# Patient Record
Sex: Female | Born: 1950 | Race: White | Hispanic: No | Marital: Married | Smoking: Never smoker
Health system: Southern US, Community
[De-identification: ages and names within clinical notes are randomized; demographics above are authoritative.]

## PROBLEM LIST (undated history)

## (undated) DIAGNOSIS — C50919 Malignant neoplasm of unspecified site of unspecified female breast: Secondary | ICD-10-CM

## (undated) DIAGNOSIS — C801 Malignant (primary) neoplasm, unspecified: Secondary | ICD-10-CM

## (undated) DIAGNOSIS — Z9889 Other specified postprocedural states: Secondary | ICD-10-CM

## (undated) DIAGNOSIS — I471 Supraventricular tachycardia: Secondary | ICD-10-CM

## (undated) DIAGNOSIS — R112 Nausea with vomiting, unspecified: Secondary | ICD-10-CM

## (undated) HISTORY — PX: BREAST LUMPECTOMY: SHX2

---

## 2003-03-08 ENCOUNTER — Other Ambulatory Visit: Admission: RE | Admit: 2003-03-08 | Discharge: 2003-03-08 | Payer: Self-pay | Admitting: *Deleted

## 2003-03-23 ENCOUNTER — Encounter: Admission: RE | Admit: 2003-03-23 | Discharge: 2003-03-23 | Payer: Self-pay | Admitting: *Deleted

## 2003-03-25 ENCOUNTER — Ambulatory Visit (HOSPITAL_COMMUNITY): Admission: RE | Admit: 2003-03-25 | Discharge: 2003-03-25 | Payer: Self-pay | Admitting: Gastroenterology

## 2003-07-15 HISTORY — PX: WRIST SURGERY: SHX841

## 2003-07-21 ENCOUNTER — Ambulatory Visit (HOSPITAL_BASED_OUTPATIENT_CLINIC_OR_DEPARTMENT_OTHER): Admission: RE | Admit: 2003-07-21 | Discharge: 2003-07-21 | Payer: Self-pay | Admitting: Orthopedic Surgery

## 2003-09-26 ENCOUNTER — Encounter: Admission: RE | Admit: 2003-09-26 | Discharge: 2003-09-26 | Payer: Self-pay | Admitting: *Deleted

## 2004-05-04 ENCOUNTER — Encounter: Admission: RE | Admit: 2004-05-04 | Discharge: 2004-05-04 | Payer: Self-pay | Admitting: *Deleted

## 2005-06-17 ENCOUNTER — Encounter: Admission: RE | Admit: 2005-06-17 | Discharge: 2005-06-17 | Payer: Self-pay | Admitting: Obstetrics and Gynecology

## 2006-09-04 ENCOUNTER — Encounter: Admission: RE | Admit: 2006-09-04 | Discharge: 2006-09-04 | Payer: Self-pay | Admitting: Obstetrics and Gynecology

## 2007-10-01 ENCOUNTER — Encounter: Admission: RE | Admit: 2007-10-01 | Discharge: 2007-10-01 | Payer: Self-pay | Admitting: Obstetrics and Gynecology

## 2008-10-18 ENCOUNTER — Encounter: Admission: RE | Admit: 2008-10-18 | Discharge: 2008-10-18 | Payer: Self-pay | Admitting: Obstetrics and Gynecology

## 2009-10-19 ENCOUNTER — Encounter: Admission: RE | Admit: 2009-10-19 | Discharge: 2009-10-19 | Payer: Self-pay | Admitting: Obstetrics and Gynecology

## 2010-06-01 NOTE — Op Note (Signed)
NAME:  Crystal Robles, Crystal Robles                       ACCOUNT NO.:  [id number]   MEDICAL RECORD NO.:  [id number]                   PATIENT TYPE:  AMB   LOCATION:  ENDO                                 FACILITY:  [facility]   PHYSICIAN:  Anselmo Rod, M.D.               DATE OF BIRTH:  [birth year]   DATE OF PROCEDURE:  03/25/2003  DATE OF DISCHARGE:                                 OPERATIVE REPORT   PROCEDURE PERFORMED:  Screening colonoscopy.   ENDOSCOPIST:  Anselmo Rod, M.D.   INSTRUMENT USED:  Olympus video colonoscope (adjustable pediatric scope).   INDICATION FOR PROCEDURE:  A 60 year old white female undergoing screening  colonoscopy to rule out colonic polyps, masses, etc.   PREPROCEDURE PREPARATION:  Informed consent was procured from the patient.  The patient was fasted for eight hours prior to the procedure and prepped  with a bottle of magnesium citrate and a gallon of GoLYTELY the night prior  to the procedure.  She also received 400 mg of Cipro intravenously for  prophylaxis because of a history of valvular dysfunction and a questionable  history of MVP.   PREPROCEDURE PHYSICAL:  VITAL SIGNS:  The patient had stable vital signs.  NECK:  Supple.  CHEST:  Clear to auscultation.  S1, S2 regular.  ABDOMEN:  Soft with normal bowel sounds.   DESCRIPTION OF PROCEDURE:  The patient was placed in the left lateral  decubitus position and sedated with 80 mg of Demerol and 8 mg of Versed in  slow incremental doses.  Once the patient was adequately sedate and  maintained on low-flow oxygen and continuous cardiac monitoring, the Olympus  video colonoscope was advanced from the rectum to the cecum without  difficulty.  No masses, polyps, erosions, or diverticula were seen.  The  appendiceal orifice and the ileocecal valve were clearly visualized and  photographed.  Retroflexion in the rectum revealed no abnormalities.  The  patient tolerated the procedure well without immediate  complications.   IMPRESSION:  Normal colonoscopy up to the cecum.  No masses, polyps, or  diverticulosis noted.   RECOMMENDATIONS:  1. Repeat CRC screening is recommended in the next 10 years unless the     patient develops any abnormal     symptoms in the interim.  2. Outpatient follow-up as the need arises in the future.  3. Continue a high-fiber diet with liberal fluid intake.                                               Anselmo Rod, M.D.    JNM/MEDQ  D:  03/25/2003  T:  03/26/2003  Job:  [id number]   cc:   Marjory Lies, M.D.  [address]  [place]  [place] [address]  Fax: [phone]
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
## 2010-11-30 ENCOUNTER — Other Ambulatory Visit: Payer: Self-pay | Admitting: Obstetrics and Gynecology

## 2010-11-30 DIAGNOSIS — Z1231 Encounter for screening mammogram for malignant neoplasm of breast: Secondary | ICD-10-CM

## 2010-12-27 ENCOUNTER — Ambulatory Visit
Admission: RE | Admit: 2010-12-27 | Discharge: 2010-12-27 | Disposition: A | Discharge status: Home / Self Care | Payer: Managed Care, Other (non HMO) | Source: Ambulatory Visit | Attending: Obstetrics and Gynecology | Admitting: Obstetrics and Gynecology

## 2010-12-27 DIAGNOSIS — Z1231 Encounter for screening mammogram for malignant neoplasm of breast: Secondary | ICD-10-CM

## 2011-10-03 ENCOUNTER — Other Ambulatory Visit: Payer: Self-pay | Admitting: Obstetrics and Gynecology

## 2011-10-03 DIAGNOSIS — Z1231 Encounter for screening mammogram for malignant neoplasm of breast: Secondary | ICD-10-CM

## 2011-12-30 ENCOUNTER — Ambulatory Visit
Admission: RE | Admit: 2011-12-30 | Discharge: 2011-12-30 | Disposition: A | Discharge status: Home / Self Care | Payer: Self-pay | Source: Ambulatory Visit | Attending: Obstetrics and Gynecology | Admitting: Obstetrics and Gynecology

## 2011-12-30 DIAGNOSIS — Z1231 Encounter for screening mammogram for malignant neoplasm of breast: Secondary | ICD-10-CM

## 2012-06-02 ENCOUNTER — Encounter: Payer: Self-pay | Admitting: Sports Medicine

## 2012-06-02 ENCOUNTER — Ambulatory Visit (INDEPENDENT_AMBULATORY_CARE_PROVIDER_SITE_OTHER): Payer: BC Managed Care – PPO | Admitting: Sports Medicine

## 2012-06-02 VITALS — BP 125/81 | HR 60 | Ht 64.25 in | Wt 148.0 lb

## 2012-06-02 DIAGNOSIS — M25519 Pain in unspecified shoulder: Secondary | ICD-10-CM

## 2012-06-02 DIAGNOSIS — M25511 Pain in right shoulder: Secondary | ICD-10-CM

## 2012-06-02 NOTE — Assessment & Plan Note (Signed)
This is clealry improving Given a series of HEP with RC strength work Keep weight very low for 6 weeks  If still symptomatic at 6 weeks, I want to reck and do [place]  Ice prn motrin

## 2012-06-02 NOTE — Progress Notes (Signed)
Patient ID: Crystal Robles, female   DOB: 07-15-1950, 62 y.o.   MRN: [id number]  RT shoulder pain past 2 mos Started at end of day after weight class No specific lift Now doing light weight with slt pain only ADLS if too much make it throb at night Icing - and this is somewhat better Rested some and cut weights Now definitely better Wants recheck before starting much weight work  Pexam  NAD  Shoulder: Inspection reveals no abnormalities, atrophy or asymmetry. Palpation is normal with no tenderness over AC joint or bicipital groove. ROM is full in all planes. Rotator cuff strength normal throughout. No signs of impingement with negative Neer and Hawkin's tests, empty can. Speeds and Yergason's tests normal. No labral pathology noted with negative Obrien's, negative clunk and good stability. Normal scapular function observed. No painful arc and no drop arm sign. No apprehension sign  What she does note is some mild pain in post cuff and upper deltoid on IR/ER testing as well as cross over

## 2012-11-24 ENCOUNTER — Other Ambulatory Visit: Payer: Self-pay

## 2012-11-24 DIAGNOSIS — Z1231 Encounter for screening mammogram for malignant neoplasm of breast: Secondary | ICD-10-CM

## 2012-12-03 ENCOUNTER — Other Ambulatory Visit: Payer: Self-pay | Admitting: Obstetrics and Gynecology

## 2012-12-03 DIAGNOSIS — M8580 Other specified disorders of bone density and structure, unspecified site: Secondary | ICD-10-CM

## 2012-12-30 ENCOUNTER — Ambulatory Visit: Payer: BC Managed Care – PPO

## 2013-01-08 ENCOUNTER — Ambulatory Visit
Admission: RE | Admit: 2013-01-08 | Discharge: 2013-01-08 | Disposition: A | Discharge status: Home / Self Care | Payer: BC Managed Care – PPO | Source: Ambulatory Visit

## 2013-01-08 ENCOUNTER — Ambulatory Visit
Admission: RE | Admit: 2013-01-08 | Discharge: 2013-01-08 | Disposition: A | Discharge status: Home / Self Care | Payer: BC Managed Care – PPO | Source: Ambulatory Visit | Attending: Obstetrics and Gynecology | Admitting: Obstetrics and Gynecology

## 2013-01-08 DIAGNOSIS — M8580 Other specified disorders of bone density and structure, unspecified site: Secondary | ICD-10-CM

## 2013-01-08 DIAGNOSIS — Z1231 Encounter for screening mammogram for malignant neoplasm of breast: Secondary | ICD-10-CM

## 2013-12-24 ENCOUNTER — Other Ambulatory Visit: Payer: Self-pay

## 2013-12-24 DIAGNOSIS — Z1231 Encounter for screening mammogram for malignant neoplasm of breast: Secondary | ICD-10-CM

## 2014-01-10 ENCOUNTER — Ambulatory Visit
Admission: RE | Admit: 2014-01-10 | Discharge: 2014-01-10 | Disposition: A | Discharge status: Home / Self Care | Payer: BC Managed Care – PPO | Source: Ambulatory Visit

## 2014-01-10 ENCOUNTER — Encounter (INDEPENDENT_AMBULATORY_CARE_PROVIDER_SITE_OTHER): Payer: Self-pay

## 2014-01-10 DIAGNOSIS — Z1231 Encounter for screening mammogram for malignant neoplasm of breast: Secondary | ICD-10-CM

## 2014-03-02 ENCOUNTER — Encounter: Payer: Self-pay | Admitting: Sports Medicine

## 2014-03-02 ENCOUNTER — Ambulatory Visit (INDEPENDENT_AMBULATORY_CARE_PROVIDER_SITE_OTHER): Payer: BLUE CROSS/BLUE SHIELD | Admitting: Sports Medicine

## 2014-03-02 VITALS — BP 106/68 | HR 67 | Ht 64.0 in | Wt 132.0 lb

## 2014-03-02 DIAGNOSIS — M67919 Unspecified disorder of synovium and tendon, unspecified shoulder: Secondary | ICD-10-CM | POA: Insufficient documentation

## 2014-03-02 DIAGNOSIS — M67911 Unspecified disorder of synovium and tendon, right shoulder: Secondary | ICD-10-CM | POA: Diagnosis not present

## 2014-03-02 MED ORDER — NITROGLYCERIN 0.2 MG/HR TD PT24: MEDICATED_PATCH | TRANSDERMAL | Status: DC

## 2014-03-02 NOTE — Patient Instructions (Signed)
You have a partial thickness tear in the supraspinatus (a rotator cuff tendon) with also some mild arthritis of the humeral head and long head of the biceps tendon irritation.  -The following rehab exercises will be beneficial. Try to do these at least 4-5 times per week. 1. Palm up/palm down with elbow bent to 90 degrees, 3-5 pound weight. 10-15 times for 3 sets. 2. Biceps curls with 3-5 pound weight, 10-15 x 3 sets. 3. Spokes on a wheel, holding 3-5 pound weight, raise arm straight in front to shoulder level, then out to 45 degrees, then to side at 90 degrees. 10-15 x 3 sets. 4. Lawnmower pulls, leaning forward, letting arm hand down in front of you, pull up with elbow close to your body and back down, 10-15 times x 3 sets.  Nitroglycerin Protocol   Apply 1/4 nitroglycerin patch to affected area daily.  Change position of patch within the affected area every 24 hours.  You may experience a headache during the first 1-2 weeks of using the patch, these should subside.  If you experience headaches after beginning nitroglycerin patch treatment, you may take your preferred over the counter pain reliever.  Another side effect of the nitroglycerin patch is skin irritation or rash related to patch adhesive.  Please notify our office if you develop more severe headaches or rash, and stop the patch.  Tendon healing with nitroglycerin patch may require 12 to 24 weeks depending on the extent of injury.  Men should not use if taking Viagra, Cialis, or Levitra.   Do not use if you have migraines or rosacea.  We will see you back in 6 weeks for re-scan or sooner if needed.

## 2014-03-02 NOTE — Progress Notes (Signed)
   Subjective:    Patient ID: Crystal Robles, female    DOB: 1950-06-30, 64 y.o.   MRN: [id number]  HPI Crystal Robles is a 64 year old right-hand-dominant female who presents with right shoulder pain. Onset was 6-7 months ago without any known injury, but she is fairly active with kayaking. Location of pain is superior lateral and anterior right shoulder. Symptoms are aggravated with reaching overhead or behind her. She tried Motrin, icing, and a few visits with physical therapy. Despite this, her symptoms have persisted. Occasionally her pain will wake her up at night. She denies any frequent radiation to the hand, numbness, tingling, or weakness. She denies any significant neck pain.  Past medical history, social history, medications, and allergies were reviewed and are up to date in the chart. Review of Systems 7 point review of systems was performed and was otherwise negative unless noted in the history of present illness.     Objective:   Physical Exam BP 106/68 mmHg  Pulse 67  Ht 5\' 4"  (1.626 m)  Wt 132 lb (59.875 kg)  BMI 22.65 kg/m2 GEN: The patient is well-developed well-nourished female and in no acute distress.  She is awake alert and oriented x3. SKIN: warm and well-perfused, no rash  Neuro: Strength 5/5 globally. Sensation intact throughout. No focal deficits. Vasc: +2 bilateral distal pulses. No edema.  MSK: Atrophy: none   Cervical ROM Full  Shoulder ROM: Right <---> Left   Forward flexion 170<--->180 ER at side 60<--->60 Abd ER 90<--->90 Abd IR 60<--->60 IR up back T10<--->T6  TTP:  AC joint:  -  Supraspinatus insertion:  +  Subsca/biceps:  -  Periscapular:  -  Trap:  -  Cuff: Impingement/cuff: Hawkins +   Jobes: +  FF strength:4/5   ER strength:5/5   Abdominal compression test:- Lag signs:None  Laxity/instabilty:  -  Yergason:  -           Speeds:  -    Crank:  -                     Active Compression: -  Neurovascular: Normal sensation to light touch in  median ulnar and radial nerve distribution with good strength in hand intrinsics grip and EPL, 2+ radial pulse.  Limited musculoskeletal ultrasound: Long and short axis views were obtained of the right shoulder. There appears to be hypoechoic fluid collection seen around the biceps tendon. The biceps tendon fibers appear grossly intact. The subscapularis appears normal. The supraspinatus has a 30-40% partial thickness tear with slight partial retraction. In the interval view, this tear and its surrounding hypoechoic fluid seems to communicate with the biceps tendon sheath. There is a small microcalcification seen within the supraspinatus tear and increased neovascularization on Doppler. The infraspinatus and teres minor appear normal. The acromioclavicular joint appears normal. There are some degenerative changes in the anterior portion of the humeral head.     Assessment & Plan:  Please see problem based assessment and plan in the problem list.

## 2014-03-02 NOTE — Assessment & Plan Note (Signed)
-  Nitroglycerin protocol -Home exercise program -Avoid aggravating factors -Follow-up 6 weeks for rescan or sooner if needed

## 2014-04-20 ENCOUNTER — Ambulatory Visit (INDEPENDENT_AMBULATORY_CARE_PROVIDER_SITE_OTHER): Payer: BLUE CROSS/BLUE SHIELD | Admitting: Sports Medicine

## 2014-04-20 ENCOUNTER — Encounter: Payer: Self-pay | Admitting: Sports Medicine

## 2014-04-20 VITALS — BP 109/44 | Ht 64.5 in | Wt 132.0 lb

## 2014-04-20 DIAGNOSIS — M67911 Unspecified disorder of synovium and tendon, right shoulder: Secondary | ICD-10-CM

## 2014-04-20 MED ORDER — NITROGLYCERIN 0.2 MG/HR TD PT24: MEDICATED_PATCH | TRANSDERMAL | Status: DC

## 2014-04-20 NOTE — Assessment & Plan Note (Signed)
Interval improvement in the scan showing approximately 10% partial thickness tear with retraction 04/20/14 on ultrasound. The hypoechoic fluid collection surrounding the biceps tendon persists, but the tendon appears normal. -Continue nitroglycerin protocol for 6 additional weeks, but may require longer. -Advance home exercise program gradually, with caution against being too aggressive due to risk of complete tendon rupture. -Refilled nitroglycerin patches today -Follow-up in 6 weeks for reevaluation with ultrasound or sooner if needed.

## 2014-04-20 NOTE — Progress Notes (Signed)
   Subjective:    Patient ID: Crystal Robles, female    DOB: 01/03/1951, 64 y.o.   MRN: [id number]  HPI Mrs. Crystal Robles is a 64 year old right-hand-dominant female who presents for follow-up of right shoulder pain. Onset was 8-9 months ago without any known injury. Location of pain is superior lateral and anterior right shoulder. Symptoms are aggravated with reaching overhead or behind her. She initially tried Motrin, icing, and a few visits with physical therapy. She has been using the nitroglycerin patch daily for the past 6 weeks. She feels that her pain is improving, but she still has some mild discomfort in the right shoulder. She has been compliant with her home exercise program. She denies anyradiation to the hand, numbness, tingling, or weakness. She denies any significant neck pain.  Past medical history, social history, medications, and allergies were reviewed and are up to date in the chart.  Review of Systems 7 point review of systems was performed and was otherwise negative unless noted in the history of present illness.     Objective:   Physical Exam BP 109/44 mmHg  Ht 5' 4.5" (1.638 m)  Wt 132 lb (59.875 kg)  BMI 22.32 kg/m2 GEN: The patient is well-developed well-nourished female and in no acute distress.  She is awake alert and oriented x3. SKIN: warm and well-perfused, no rash  Neuro: Strength 5/5 globally. Sensation intact throughout. No focal deficits. Vasc: +2 bilateral distal pulses. MSK: Examination of the right shoulder reveals grossly full range of motion in all planes. Positive Jobes test. Negative speeds test. Positive Hawkins test. No tenderness at the acromioclavicular joint. She has fairly good rotator cuff strength. Negative abdominal compression test.  Limited musculoskeletal ultrasound: Long and short axis views were obtained of the right shoulder. Examination of the biceps reveals redemonstration of the previously seen hypoechoic fluid collection surrounding  the biceps tendon fibers. The tendon itself appears grossly fully intact. Examination of the supraspinatus reveals interval improvement in previously seen partial thickness tear. She has approximately 10% partial thickness tear currently, which is improved from the previous 30%. There is still retraction seen distally at the partially torn region. There is a micro-calcification seen adjacent to the tear. The infraspinatus, teres minor, and acromioclavicular joints appear normal.     Assessment & Plan:  Please see problem based assessment and plan in the problem list.

## 2014-06-01 ENCOUNTER — Ambulatory Visit (INDEPENDENT_AMBULATORY_CARE_PROVIDER_SITE_OTHER): Payer: BLUE CROSS/BLUE SHIELD | Admitting: Sports Medicine

## 2014-06-01 ENCOUNTER — Encounter: Payer: Self-pay | Admitting: Sports Medicine

## 2014-06-01 VITALS — BP 94/42 | Ht 64.5 in | Wt 132.0 lb

## 2014-06-01 DIAGNOSIS — M67911 Unspecified disorder of synovium and tendon, right shoulder: Secondary | ICD-10-CM | POA: Diagnosis not present

## 2014-06-01 MED ORDER — NITROGLYCERIN 0.2 MG/HR TD PT24: MEDICATED_PATCH | TRANSDERMAL | Status: DC

## 2014-06-01 NOTE — Assessment & Plan Note (Signed)
Ultrasound with stable ultrasound.  Compared to last time with repeat demonstration of less than 10% partial-thickness tear of the supraspinatus without retraction and less hyperkeratotic around the biceps tendon. -Continue home exercise program -Continue nitroglycerin for the next 3 months, refilled -Plan followup in 3 months or sooner if needed

## 2014-06-01 NOTE — Progress Notes (Signed)
   Subjective:    Patient ID: Crystal Robles, female    DOB: [birth year], 64 y.o.   MRN: [id number]  HPI Crystal Robles is a 64 year-old female who presents for follow-up of right shoulder pain.  She says that she is 85% improved.  She has been doing home exercise program including scapular stabilization exercises.  She has been on the nitroglycerin patch for 12 weeks. Recall that she had a 30% partial thickness tear of the supraspinatus with retraction seen in February, which had improved to approximately 10% in March.  She also had some fluid surrounding her biceps tendon without frank tear of the biceps fibers. Symptoms are aggravated with abduction of the arm above 90.  She denies any weakness, numbness, or tingling.  Past medical history, social history, medications, and allergies were reviewed and are up to date in the chart.  Review of Systems 7 point review of systems was performed and was otherwise negative unless noted in the history of present illness.     Objective:   Physical Exam BP 94/42 mmHg  Ht 5' 4.5" (1.638 m)  Wt 132 lb (59.875 kg)  BMI 22.32 kg/m2 GEN: The patient is well-developed well-nourished female and in no acute distress.  She is awake alert and oriented x3. SKIN: warm and well-perfused, no rash  Neuro: Strength 5/5 globally. Sensation intact throughout. DTRs 2/4 bilaterally. No focal deficits. Vasc: +2 bilateral distal pulses. No edema.  MSK: Atrophy: [none]   Cervical ROM [Full]    Shoulder ROM: Right <---> Left   Forward flexion [180]<--->[180] ER at side [60]<--->[60] Abd ER [90]<--->[90] Abd IR [60]<--->[60] IR up back [T10]<--->[T6]  TTP:  AC joint:  [-]  Supraspinatus insertion:  [-]  Subsca/biceps:  [-]  Periscapular:  [-]  Trap:  [-]  Cuff: Impingement/cuff: Hawkins [-]   Jobes: [-]   FF strength:[5/5]   ER strength:[5/5]   Abdominal compression test:[-] Lag signs:[None]  Laxity/instabilty:  [-]  Yergason:  [-]           Speeds:  [-]      Crank:  [-]                     Active Compression: [-]  Neurovascular: [Normal sensation to light touch in median ulnar and radial nerve distribution with good strength in hand intrinsics grip and EPL, 2+ radial pulse.]  Limited musculoskeletal ultrasound: long and short axis views are obtained of the right shoulder.  There still appears to be a less than 10% distal supraspinatus partial thickness tear with mild retraction.  The microcalcification was redemonstrated.  There appears to be less hypoechoic fluid surrounding the biceps tendon with intact fibers.    Assessment & Plan:  Please see problem based assessment and plan in the problem list.

## 2014-12-22 ENCOUNTER — Other Ambulatory Visit: Payer: Self-pay

## 2014-12-22 DIAGNOSIS — Z1231 Encounter for screening mammogram for malignant neoplasm of breast: Secondary | ICD-10-CM

## 2015-01-12 ENCOUNTER — Ambulatory Visit
Admission: RE | Admit: 2015-01-12 | Discharge: 2015-01-12 | Disposition: A | Discharge status: Home / Self Care | Payer: BLUE CROSS/BLUE SHIELD | Source: Ambulatory Visit

## 2015-01-12 DIAGNOSIS — Z1231 Encounter for screening mammogram for malignant neoplasm of breast: Secondary | ICD-10-CM

## 2016-02-12 ENCOUNTER — Other Ambulatory Visit: Payer: Self-pay | Admitting: Obstetrics and Gynecology

## 2016-02-12 DIAGNOSIS — Z1231 Encounter for screening mammogram for malignant neoplasm of breast: Secondary | ICD-10-CM

## 2016-03-08 ENCOUNTER — Ambulatory Visit
Admission: RE | Admit: 2016-03-08 | Discharge: 2016-03-08 | Disposition: A | Discharge status: Home / Self Care | Payer: BLUE CROSS/BLUE SHIELD | Source: Ambulatory Visit | Attending: Obstetrics and Gynecology | Admitting: Obstetrics and Gynecology

## 2016-03-08 DIAGNOSIS — Z1231 Encounter for screening mammogram for malignant neoplasm of breast: Secondary | ICD-10-CM

## 2016-10-08 ENCOUNTER — Encounter: Payer: Self-pay | Admitting: Sports Medicine

## 2016-10-08 ENCOUNTER — Ambulatory Visit (INDEPENDENT_AMBULATORY_CARE_PROVIDER_SITE_OTHER): Payer: BLUE CROSS/BLUE SHIELD | Admitting: Sports Medicine

## 2016-10-08 ENCOUNTER — Ambulatory Visit: Payer: Self-pay

## 2016-10-08 VITALS — BP 118/64 | Ht 64.0 in | Wt 140.0 lb

## 2016-10-08 DIAGNOSIS — M25511 Pain in right shoulder: Secondary | ICD-10-CM

## 2016-10-08 DIAGNOSIS — M67911 Unspecified disorder of synovium and tendon, right shoulder: Secondary | ICD-10-CM

## 2016-10-08 MED ORDER — NITROGLYCERIN 0.2 MG/HR TD PT24: MEDICATED_PATCH | TRANSDERMAL | 1 refills | Status: DC

## 2016-10-08 NOTE — Patient Instructions (Addendum)
Crystal Robles, you were seen today for right shoulder pain.  We noticed on your physical exam that you seem to be having some catching of your scapula when you move your shoulders in certain motions and pain with certain movements consistent with an impingement.  On ultrasound we saw your old tear in one muscle tendon, but also saw a bone spur in another spot that seems to be causing some inflammation with certain movements.   We are recommending some exercises to strengthen your upper back muscles and have provided you with a handout: 1. Lawnmower (one arm) 2. Robbery (two arms) 3. Biceps curl 4. Upright rows (3 lbs) 5. Forearm rolls  Please continue these exercises and also try nitroglycerine 1/4 daily on your shoulder.   Please continue with your daily exercises and nitroglycerine and we will see you back in 6 weeks.  Very nice to see you today, Crystal Robles L. Rosalyn Gess, [facility] Resident PGY-2 10/08/2016 11:22 AM

## 2016-10-08 NOTE — Progress Notes (Signed)
   [facility] [address] [place], [place] [address] Phone: [phone] Fax: [phone]   Patient Name: Crystal Robles Date of Birth: [birth year] Medical Record Number: [id number] Gender: female Date of Encounter: 10/08/2016  History of Present Illness:  Crystal Robles is a 66 y.o. very pleasant female patient who presents with the following:  Right shoulder pain for past couple of months. History of supraspinatus tear. Feels different than before. Pain is worse with shoulder abduction. Sometimes radiates to the elbow.  Pain is most evident when she is doing her ADLs (eating, chopping up food). Ibuprofen helps a little bit. Uses hot water bottle at night and sometimes this helps. Does hurt when she lies on her left side. No injuries.   Past Medical, Surgical, Social, and Family History Reviewed. Medications and Allergies reviewed and all updated if necessary.  Review of Systems:  Denies trauma, numbness, tingling or weakness in her upper extremity.   Physical Examination: Vitals:   10/08/16 1016  BP: 118/64   Vitals:   10/08/16 1016  Weight: 140 lb (63.5 kg)  Height: 5\' 4"  (1.626 m)   Body mass index is 24.03 kg/m.  General: well appearing 66 yo F in NAD Cardiac: well perfused Resp: NWOB MSK:   Shoulders: No deformities, edema or ecchymoses on inspection, some generalized TTP at the deltoid on the R side. Full ROM, but pain with abduction >70 degrees on R side with some crepitus. Discomfort with resisted internal rotation with the shoulder flexed. No AC joint tenderness. Positive obriens, empty can, hawkins and apprehension as well as clunk test. Speeds, yurgisons and sulcus signs negative. No joint instability. There was some scapular diskinesis with overhead movements, but not at rest with arms at her side. No improvement of impingement with scapular assist.  Neuro: grossly normal, no changes in sensation, strength 5/5 in upper  extremities.   Ultrasound: R shoulder At distal portion of the proximal insertion of biceps tendon there was a hypoechoic region between the pectoralis muscle and biceps tendon.  Bone spur noted at proximal insertion site of biceps tendon seen best on long axis. Normal appearing subscapularis tendon in static view, but there was a hypoechoic region between subscapularis and biceps tendon with dynamic view   Normal appearing AC joint  no subacromial or subdeltoid bursa impingement.  Improvement in oblique tear in the mid portion of the distal  Supraspinatus./ still mild hypoechoic change Infraspinatus and teres minor tendons normal  Impression: Hypoechoic change suggesting swelling at biceps tendon and subscapularis likely due to irritation from  bone spur at the insertion site of biceps tendon. Improvement seen in remote supraspinatus tear.    Ultrasound and interpretation by Wolfgang Phoenix. Raymound Katich, MD   Assessment and Plan: Tendinopathy of rotator cuff Tear at supraspinatus has improved on [place] since last visit and patient is without weakness in rotator cuff.  Impingement is reproducible on exam and patient has scapular dyskinesis with overhead movement. On [place] noted to have bone spur at biceps tendon and associated inflammation at subscapularis and biceps tendon.  - scapular strengthening exercises (lawnmower, robber, upright rows, biceps curl and forearm roll) - nitroglycerine 1/4 tab once daily - follow up in 6 weeks for repeat imaging.   Daniel L. Rosalyn Gess, [place] Resident PGY-2 10/08/2016 6:58 PM   I observed and examined the patient with the resident and agree with assessment and plan.  Note reviewed and modified by me. Stefanie Libel, MD

## 2016-10-08 NOTE — Assessment & Plan Note (Addendum)
Tear at supraspinatus has improved on [place] since last visit and patient is without weakness in rotator cuff.   Impingement is reproducible on exam and patient has scapular dyskinesis with overhead movement.  On [place] noted to have bone spur and calcific change at Humeral head near biceps tendon and associated hypoechoic swelling at intersection subscapularis and biceps tendon.  - scapular strengthening exercises (lawnmower, robber, upright rows, biceps curl and forearm roll) - nitroglycerine 1/4 tab once daily  - follow up in 6 weeks for repeat imaging.

## 2016-11-21 ENCOUNTER — Encounter: Payer: Self-pay | Admitting: Sports Medicine

## 2016-11-21 ENCOUNTER — Ambulatory Visit (INDEPENDENT_AMBULATORY_CARE_PROVIDER_SITE_OTHER): Payer: BLUE CROSS/BLUE SHIELD | Admitting: Sports Medicine

## 2016-11-21 ENCOUNTER — Ambulatory Visit: Payer: Self-pay

## 2016-11-21 VITALS — BP 110/72 | Ht 64.5 in | Wt 138.0 lb

## 2016-11-21 DIAGNOSIS — M25511 Pain in right shoulder: Secondary | ICD-10-CM

## 2016-11-21 DIAGNOSIS — M67911 Unspecified disorder of synovium and tendon, right shoulder: Secondary | ICD-10-CM

## 2016-11-21 NOTE — Progress Notes (Signed)
   [facility] Phone: [phone]  Subjective:  Crystal Robles is a 66 year old female presenting to clinic for follow-up of her right shoulder pain. She was seen in clinic on 10/08/16 with shoulder pain for the last few months. She does have a history of supraspinatus tear on the right. She had an ultrasound exam performed that showed swelling of the biceps tendon and subscapularis likely due to irritation from a bone spur. The area of her prior supraspinatus tear showed evidence of healing. She was given scapular strengthening exercises and was prescribed nitroglycerin patches. She states she is 60% better today. She has been using a fourth of a nitroglycerin patch daily. She has been taking the nitroglycerin patch off at night due to headaches. She still feels she is restricted with lateral shoulder raise. She also notes pain when walking for about 20 minutes or cleaning the floor. The pain is located in her anterior and lateral shoulder. The pain sometimes radiates down her anterior arm. She has used ibuprofen occasionally, which helps. She describes the pain as "throbbing". She denies any swelling or redness of the shoulder. She has been doing her shoulder stretching strengthening exercises every morning and sometimes twice a day.  ROS: See HPI for pertinent positives and negatives  Objective: BP 110/72   Ht 5' 4.5" (1.638 m)   Wt 138 lb (62.6 kg)   BMI 23.32 kg/m  Gen: NAD, alert, cooperative with exam Right Shoulder: No erythema, edema, or gross deformity. Full range of motion. She has pain with internal rotation of the shoulder. She has mild tenderness to palpation over the biceps tendon. Neer's test negative. Empty can test positive. O'Brien's test positive. Speed's test negative. Neuro: right upper extremity is neurovascularly intact  Ultrasound of Right Shoulder: -Fluid pocket noted at the distal portion of the proximal insertion of biceps tendon between the pectoralis muscle  and biceps tendon.  -Bone spur noted at proximal insertion site of biceps tendon seen best on long axis -Fluid pocket seen between subscapularis and biceps tendon with dynamic view  -Normal appearing AC joint -Improvement in previous supraspinatus tear with tendon intact -Infraspinatus and teres minor tendons normal Subscapularis tendon normal Abducted arm position demonstrates that fluid pocket appears like a bursal swelling  Impression: Degenerative change at glenohumeral joint on Rt that is mild but with some partial tearing of biceps tendon/ capsule creating pseudobursa  Ultrasound and interpretation by Wolfgang Phoenix. Fields, MD   Assessment/Plan: Right Shoulder Pain: Likely due to biceps tendon  irritation due to her bone spur, given that she has fluid pockets at the area where the tendons come into contact with the bone spur. - Continue 1/4th nitroglycerin patches daily. - Continue home rehabilitation exercises. - Follow-up as needed in 3 months   Hyman Bible, MD PGY-3 Ultrasound and interpretation by Wolfgang Phoenix. Oneida Alar, MD

## 2016-11-21 NOTE — Assessment & Plan Note (Signed)
Likely due to biceps tendon and subscapularis tendon irritation due to her bone spur, given that she has fluid pockets in these tendons at the area where the tendons come into contact with the bone spur. - Continue 1/4th nitroglycerin patches daily. - Continue home rehabilitation exercises. - Follow-up as needed in 3 months

## 2017-02-14 ENCOUNTER — Other Ambulatory Visit: Payer: Self-pay | Admitting: Obstetrics and Gynecology

## 2017-02-14 DIAGNOSIS — Z139 Encounter for screening, unspecified: Secondary | ICD-10-CM

## 2017-02-19 ENCOUNTER — Other Ambulatory Visit: Payer: Self-pay | Admitting: Obstetrics and Gynecology

## 2017-02-19 DIAGNOSIS — Z780 Asymptomatic menopausal state: Secondary | ICD-10-CM

## 2017-03-25 ENCOUNTER — Ambulatory Visit: Payer: BLUE CROSS/BLUE SHIELD

## 2017-03-25 ENCOUNTER — Other Ambulatory Visit: Payer: BLUE CROSS/BLUE SHIELD

## 2017-03-26 ENCOUNTER — Ambulatory Visit
Admission: RE | Admit: 2017-03-26 | Discharge: 2017-03-26 | Disposition: A | Discharge status: Home / Self Care | Payer: BLUE CROSS/BLUE SHIELD | Source: Ambulatory Visit | Attending: Obstetrics and Gynecology | Admitting: Obstetrics and Gynecology

## 2017-03-26 DIAGNOSIS — Z139 Encounter for screening, unspecified: Secondary | ICD-10-CM

## 2017-03-26 DIAGNOSIS — Z780 Asymptomatic menopausal state: Secondary | ICD-10-CM

## 2017-03-28 ENCOUNTER — Other Ambulatory Visit: Payer: Self-pay | Admitting: Obstetrics and Gynecology

## 2017-03-28 DIAGNOSIS — R928 Other abnormal and inconclusive findings on diagnostic imaging of breast: Secondary | ICD-10-CM

## 2017-04-01 ENCOUNTER — Ambulatory Visit
Admission: RE | Admit: 2017-04-01 | Discharge: 2017-04-01 | Disposition: A | Discharge status: Home / Self Care | Payer: BLUE CROSS/BLUE SHIELD | Source: Ambulatory Visit | Attending: Obstetrics and Gynecology | Admitting: Obstetrics and Gynecology

## 2017-04-01 ENCOUNTER — Other Ambulatory Visit: Payer: Self-pay | Admitting: Obstetrics and Gynecology

## 2017-04-01 DIAGNOSIS — N6320 Unspecified lump in the left breast, unspecified quadrant: Secondary | ICD-10-CM

## 2017-04-01 DIAGNOSIS — R928 Other abnormal and inconclusive findings on diagnostic imaging of breast: Secondary | ICD-10-CM

## 2017-04-04 ENCOUNTER — Other Ambulatory Visit: Payer: Self-pay | Admitting: Obstetrics and Gynecology

## 2017-04-04 ENCOUNTER — Ambulatory Visit
Admission: RE | Admit: 2017-04-04 | Discharge: 2017-04-04 | Disposition: A | Discharge status: Home / Self Care | Payer: BLUE CROSS/BLUE SHIELD | Source: Ambulatory Visit | Attending: Obstetrics and Gynecology | Admitting: Obstetrics and Gynecology

## 2017-04-04 DIAGNOSIS — N6320 Unspecified lump in the left breast, unspecified quadrant: Secondary | ICD-10-CM

## 2017-04-11 ENCOUNTER — Other Ambulatory Visit: Payer: Self-pay | Admitting: Obstetrics and Gynecology

## 2017-04-11 DIAGNOSIS — C801 Malignant (primary) neoplasm, unspecified: Secondary | ICD-10-CM

## 2017-04-11 DIAGNOSIS — C50912 Malignant neoplasm of unspecified site of left female breast: Secondary | ICD-10-CM

## 2017-04-11 DIAGNOSIS — Z170 Estrogen receptor positive status [ER+]: Secondary | ICD-10-CM

## 2017-04-23 ENCOUNTER — Other Ambulatory Visit: Payer: BLUE CROSS/BLUE SHIELD

## 2017-05-27 ENCOUNTER — Encounter: Payer: Self-pay | Admitting: Hematology and Oncology

## 2017-05-27 ENCOUNTER — Telehealth: Payer: Self-pay | Admitting: Hematology and Oncology

## 2017-05-27 ENCOUNTER — Other Ambulatory Visit: Payer: Self-pay | Admitting: General Surgery

## 2017-05-27 DIAGNOSIS — R928 Other abnormal and inconclusive findings on diagnostic imaging of breast: Secondary | ICD-10-CM

## 2017-05-27 NOTE — Telephone Encounter (Signed)
Referral from Dr. Donne Hazel at [facility] fro breast cancer.  Pt has been scheduled to see Dr. Lindi Adie on 5/23 at 345pm. Pt aware to arrive 30 minutes early. Letter and directions mailed to the pt.

## 2017-05-28 ENCOUNTER — Encounter: Payer: Self-pay | Admitting: Radiation Oncology

## 2017-06-05 ENCOUNTER — Inpatient Hospital Stay: Payer: BLUE CROSS/BLUE SHIELD | Attending: Hematology and Oncology | Admitting: Hematology and Oncology

## 2017-06-05 ENCOUNTER — Ambulatory Visit
Admission: RE | Admit: 2017-06-05 | Discharge: 2017-06-05 | Disposition: A | Discharge status: Home / Self Care | Payer: BLUE CROSS/BLUE SHIELD | Source: Ambulatory Visit | Attending: General Surgery | Admitting: General Surgery

## 2017-06-05 DIAGNOSIS — Z170 Estrogen receptor positive status [ER+]: Secondary | ICD-10-CM | POA: Insufficient documentation

## 2017-06-05 DIAGNOSIS — C50412 Malignant neoplasm of upper-outer quadrant of left female breast: Secondary | ICD-10-CM | POA: Diagnosis present

## 2017-06-05 DIAGNOSIS — R928 Other abnormal and inconclusive findings on diagnostic imaging of breast: Secondary | ICD-10-CM

## 2017-06-05 NOTE — Progress Notes (Signed)
[facility] CONSULT NOTE  Patient Care Team: [facility] PCP - General (Family Medicine)  CHIEF COMPLAINTS/PURPOSE OF CONSULTATION:  Newly diagnosed breast cancer  HISTORY OF PRESENTING ILLNESS:  Crystal Robles 67 y.o. female is here because of recent diagnosis of left breast cancer.  Patient had a routine screening mammogram that detected abnormalities and asymmetries in the left breast.  Ultrasound detected a 9 mm area of abnormality at the 1 o'clock position.  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  She was seen by Dr. Donne Hazel who recommended lumpectomy and refer her to [place] for discussion regarding the treatment options for adjuvant therapy.  Dr. Mittie Bodo has been her gynecologist who helped get the work-up done.  She had previously done genetic testing which was apparently negative.  She has seen [facility] oncology and decided to get treated locally. There has been a large gap between her diagnosis and currently because she wanted to wait till her daughter got married and graduated.  I reviewed her records extensively and collaborated the history with the patient.  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 Retired Marine scientist.  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 FAMILY HISTORY: No FH of cancers ALLERGIES:  is allergic to biaxin [clarithromycin]; codeine; and penicillins.  MEDICATIONS:  Current Outpatient Medications  Medication Sig Dispense Refill  . azithromycin (ZITHROMAX) 250 MG tablet   1  . fluticasone (FLONASE) 50 MCG/ACT nasal spray Place 2 sprays into both nostrils daily.    . nitroGLYCERIN (NITRODUR - DOSED IN MG/24 HR) 0.2 mg/hr patch Apply 1/4 patch to affected area once daily. 30 patch 1  . PROMETRIUM 100 MG capsule Take 1 capsule by mouth daily.     No current facility-administered medications for this visit.     REVIEW OF SYSTEMS:   Constitutional: Denies fevers, chills or abnormal night sweats Eyes: Denies blurriness of vision, double vision or watery eyes Ears, nose, mouth, throat, and face: Denies mucositis or sore throat Respiratory: Denies cough, dyspnea or wheezes Cardiovascular: Denies palpitation, chest discomfort or lower extremity swelling Gastrointestinal:  Denies nausea, heartburn or change in bowel habits Skin: Denies abnormal skin rashes Lymphatics: Denies new lymphadenopathy or easy bruising Neurological:Denies numbness, tingling or new weaknesses Behavioral/Psych: Mood is stable, no new changes  Breast:  Denies any palpable lumps or discharge All other systems were reviewed with the patient and are negative.  PHYSICAL EXAMINATION: ECOG PERFORMANCE STATUS: 0 - Asymptomatic  Vitals:   06/05/17 1533  BP: 116/62  Pulse: 63  Resp: 18  Temp: 97.8 F (36.6 C)  SpO2: 98%   Filed Weights   06/05/17 1533  Weight: 146 lb 6.4 oz (66.4 kg)    GENERAL:alert, no distress and comfortable SKIN: skin color, texture, turgor are normal,  no rashes or significant lesions EYES: normal, conjunctiva are pink and non-injected, sclera clear OROPHARYNX:no exudate, no erythema and lips, buccal mucosa, and tongue normal  NECK: supple, thyroid normal size, non-tender, without nodularity LYMPH:   no palpable lymphadenopathy in the cervical, axillary or inguinal LUNGS: clear to auscultation and percussion with normal breathing effort HEART: regular rate & rhythm and no murmurs and no lower extremity edema ABDOMEN:abdomen soft, non-tender and normal bowel sounds Musculoskeletal:no cyanosis of digits and no clubbing  PSYCH: alert & oriented x 3 with fluent speech NEURO: no focal motor/sensory deficits  ASSESSMENT AND PLAN:  Malignant neoplasm of upper-outer quadrant of left breast in female, estrogen receptor positive ([place]) 04/04/2017: Screening mammogram detected asymmetries left breast, ultrasound revealed 1 o'clock position 7 cm from nipple 7 x 7 x 9 mm area of mammographic abnormality, biopsy revealed 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 After reviewing the pathology in detail, we proceeded to discuss the different treatment options between surgery, radiation, chemotherapy, antiestrogen therapies.  Recommendation: 1. Breast conserving surgery with sentinel lymph node biopsy 2. adjuvant radiation therapy 3.  Followed by adjuvant antiestrogen therapy with letrozole 2.5 mg daily x5 years (patient does not want to take antiestrogen therapy because she is worried about the risks of hot flashes myalgias and the risk of osteoporosis.  She tells me that she has osteopenia and that she is willing to take the risk of recurrent breast cancer over any adverse effects of antiestrogen therapy.  I anticipate that her risk of distant recurrence would probably be around 10%.  Return to clinic after surgery to discuss the pathology report and to decide if Oncotype DX needs to  be done.   All questions were answered. The patient knows to call the clinic with any problems, questions or concerns.    Harriette Ohara, MD 06/05/17

## 2017-06-05 NOTE — Assessment & Plan Note (Signed)
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 After reviewing the pathology in detail, we proceeded to discuss the different treatment options between surgery, radiation, chemotherapy, antiestrogen therapies.  Recommendation: 1. Breast conserving surgery with sentinel lymph node biopsy 2. adjuvant radiation therapy 3.  Followed by adjuvant antiestrogen therapy with letrozole 2.5 mg daily x5 years   Return to clinic after surgery to discuss the pathology report and to decide if Oncotype DX needs to be done.

## 2017-06-06 ENCOUNTER — Other Ambulatory Visit: Payer: Self-pay | Admitting: General Surgery

## 2017-06-06 ENCOUNTER — Encounter: Payer: Self-pay | Admitting: *Deleted

## 2017-06-06 DIAGNOSIS — Z170 Estrogen receptor positive status [ER+]: Principal | ICD-10-CM

## 2017-06-06 DIAGNOSIS — C50412 Malignant neoplasm of upper-outer quadrant of left female breast: Secondary | ICD-10-CM

## 2017-06-10 NOTE — Progress Notes (Signed)
Location of Breast Cancer: Malignant neoplasm of upper outer quadrant of left breast ER/PR +  Did patient present with symptoms (if so, please note symptoms) or was this found on screening mammography?:   Patient had a routine mammogram that detected abnormalities and asymmetries in the left breast.  Ultrasound detected a 7 x 7 x 9 mm area of abnormality at the 1 o'clock position 7 cm from the nipple.  Histology per Pathology Report: Left breast 04/04/2017    Receptor Status: ER(+ 100%), PR (+ 95%), Her2-neu (-), Ki-67(2%)   Past/Anticipated interventions by surgeon, if any: Dr. Donne Hazel 05/26/2017 No surgery scheduled at this time. -I will refer her to see Dr. Lindi Adie and radiation oncology here in [place].   -We discussed the staging and pathophysiology of breast cancer.  We discussed all the different options for treatment for breast cancer including surgery, chemotherapy, radiation therapy, herceptin and antiestrogen therapy. -We discussed the options for treatment of the breast cancer which included lumpectomy versus mastectomy.  -We also discussed that she will be recommended radiation therapy if she undergoes lumpectomy. -The decision for lumpectomy vs mastectomy has no impact on decision for chemotherapy.   -We discussed that there is no difference in her survival whether she undergoes lumpectomy with radiation therapy or antiestrogen therapy versus mastectomy.  Past/Anticipated interventions by medical oncology, if any: Chemotherapy  Dr. Lindi Adie 06/05/2017 1. Breast conserving surgery with sentinel lymph node biopsy 2. adjuvant radiation therapy 3.  Followed by adjuvant antiestrogen therapy with letrozole 2.5 mg daily x5 years (patient does not want to take antiestrogen therapy because she is worried about the risks of hot flashes myalgias and the risk of osteoporosis.  She tells me that she has osteopenia and that she is willing to take the risk of recurrent breast cancer over any  adverse effects of antiestrogen therapy.  I anticipate that her risk of distant recurrence would probably be around 10%. 4. Return to clinic after surgery to discuss the pathology report and to decide if Oncotype DX needs to be done.   Lymphedema issues, if any:  No  Pain issues, if any: None at this time.  BP 132/76 (BP Location: Right Leg, Patient Position: Sitting, Cuff Size: Normal)   Pulse (!) 59   Temp 98.3 F (36.8 C) (Oral)   Resp 18   Ht 5' 4"  (1.626 m)   Wt 145 lb 12.8 oz (66.1 kg)   SpO2 98%   BMI 25.03 kg/m    Wt Readings from Last 3 Encounters:  06/11/17 145 lb 12.8 oz (66.1 kg)  06/11/17 145 lb 12.8 oz (66.1 kg)  06/05/17 146 lb 6.4 oz (66.4 kg)    SAFETY ISSUES:  Prior radiation? No  Pacemaker/ICD? No  Possible current pregnancy? No  Is the patient on methotrexate? No  Current Complaints / other details:      Crystal Razor, RN 06/10/2017,7:24 AM

## 2017-06-11 ENCOUNTER — Ambulatory Visit
Admission: RE | Admit: 2017-06-11 | Discharge: 2017-06-11 | Disposition: A | Discharge status: Home / Self Care | Payer: BLUE CROSS/BLUE SHIELD | Source: Ambulatory Visit | Attending: Radiation Oncology | Admitting: Radiation Oncology

## 2017-06-11 ENCOUNTER — Other Ambulatory Visit: Payer: Self-pay

## 2017-06-11 ENCOUNTER — Encounter: Payer: Self-pay | Admitting: Radiation Oncology

## 2017-06-11 VITALS — BP 132/76 | HR 59 | Temp 98.3°F | Resp 18 | Wt 145.8 lb

## 2017-06-11 VITALS — BP 132/76 | HR 59 | Temp 98.3°F | Resp 18 | Ht 64.0 in | Wt 145.8 lb

## 2017-06-11 DIAGNOSIS — Z79899 Other long term (current) drug therapy: Secondary | ICD-10-CM | POA: Diagnosis not present

## 2017-06-11 DIAGNOSIS — I471 Supraventricular tachycardia: Secondary | ICD-10-CM | POA: Insufficient documentation

## 2017-06-11 DIAGNOSIS — C50412 Malignant neoplasm of upper-outer quadrant of left female breast: Secondary | ICD-10-CM | POA: Diagnosis present

## 2017-06-11 DIAGNOSIS — Z170 Estrogen receptor positive status [ER+]: Secondary | ICD-10-CM | POA: Insufficient documentation

## 2017-06-11 DIAGNOSIS — Z800 Family history of malignant neoplasm of digestive organs: Secondary | ICD-10-CM | POA: Diagnosis not present

## 2017-06-11 HISTORY — DX: Supraventricular tachycardia: I47.1

## 2017-06-11 NOTE — Progress Notes (Signed)
Radiation Oncology         [phone] ________________________________  Name: Crystal Robles        MRN: [id number]  Date of Service: 06/11/2017 DOB: [birth year]  JS:[id number], [place], [place], MD     REFERRING PHYSICIAN: Rolm Bookbinder, MD   DIAGNOSIS: The encounter diagnosis was Malignant neoplasm of upper-outer quadrant of left breast in female, estrogen receptor positive ([place]).   HISTORY OF PRESENT ILLNESS: Crystal Robles is a 67 y.o. female seen at the request of Dr. Donne Hazel for a newly diagnosed left breast cancer. The patient was found on screening mammogram to have an assymmetry in the left breast in March 2019 measuring 9 mm on diagnostic imaging at 1:00. 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 She did have a second biopsy after undergoing repeat ultrasound in May 2019, and an area in the posterior upper outer left breast and this was biopsied and fibrocystic change was seen. She is contemplating lumpectomy and sentinel node biopsy on 06/20/17. She has also met with Dr. Lindi Adie and her tumor may be tested for oncotype, and she has decided to continue HRT and has been counseled on this in the presence of ER positive disease. She comes today do discuss options of adjuvant radiotherapy.    PREVIOUS RADIATION THERAPY: No   PAST MEDICAL HISTORY:  Past Medical History:  Diagnosis Date  . PAT (paroxysmal atrial tachycardia) ([place])        PAST SURGICAL HISTORY: Past Surgical History:  Procedure Laterality Date  . CESAREAN SECTION  1993  . WRIST SURGERY Left 07/2003     FAMILY HISTORY:  Family History  Problem Relation Age of Onset  . Pancreatic cancer Mother   . Diabetes Father   . Diabetes Brother      SOCIAL HISTORY:  reports that she has never smoked. She has never used smokeless tobacco. She reports that she drank  alcohol. She reports that she does not use drugs.   ALLERGIES: Avelox [moxifloxacin hcl in nacl]; Biaxin [clarithromycin]; Codeine; and Penicillins   MEDICATIONS:  Current Outpatient Medications  Medication Sig Dispense Refill  . calcium carbonate (CALCIUM 600) 600 MG TABS tablet Take by mouth.    . Cholecalciferol (VITAMIN D-1000 MAX ST) 1000 units tablet Take by mouth.    . estradiol (VIVELLE-DOT) 0.075 MG/24HR Place onto the skin.    . fluticasone (FLONASE) 50 MCG/ACT nasal spray Place 2 sprays into both nostrils daily.    . magnesium oxide (MAG-OX) 400 MG tablet Take by mouth.    . nitroGLYCERIN (NITRODUR - DOSED IN MG/24 HR) 0.2 mg/hr patch Apply 1/4 patch to affected area once daily. 30 patch 1  . progesterone (PROMETRIUM) 100 MG capsule Take by mouth.    Marland Kitchen PROMETRIUM 100 MG capsule Take 1 capsule by mouth daily.    Marland Kitchen zinc gluconate 50 MG tablet Take by mouth.    Marland Kitchen azithromycin (ZITHROMAX) 250 MG tablet   1   No current facility-administered medications for this encounter.      REVIEW OF SYSTEMS: On review of systems, the patient reports that she is doing well overall. No complaints are verbalized.    PHYSICAL EXAM:  Wt Readings from Last 3 Encounters:  06/11/17 145 lb 12.8 oz (66.1 kg)  06/11/17 145 lb 12.8 oz (66.1 kg)  06/05/17 146 lb 6.4 oz (66.4 kg)  Temp Readings from Last 3 Encounters:  06/11/17 98.3 F (36.8 C) (Oral)  06/11/17 98.3 F (36.8 C) (Oral)  06/05/17 97.8 F (36.6 C) (Oral)   BP Readings from Last 3 Encounters:  06/11/17 132/76  06/11/17 132/76  06/05/17 116/62   Pulse Readings from Last 3 Encounters:  06/11/17 (!) 59  06/11/17 (!) 59  06/05/17 63   Pain Assessment Pain Score: 0-No pain/10  In general this is a well appearing caucasian female in no acute distress. She is alert and oriented x4 and appropriate throughout the examination. HEENT reveals that the patient is normocephalic, atraumatic. EOMs are intact.  Skin is intact without  any evidence of gross lesions. Cardiopulmonary assessment is negative for acute distress and she exhibits normal effort. Breast exam is deferred.  ECOG = 0  0 - Asymptomatic (Fully active, able to carry on all predisease activities without restriction)  1 - Symptomatic but completely ambulatory (Restricted in physically strenuous activity but ambulatory and able to carry out work of a light or sedentary nature. For example, light housework, office work)  2 - Symptomatic, <50% in bed during the day (Ambulatory and capable of all self care but unable to carry out any work activities. Up and about more than 50% of waking hours)  3 - Symptomatic, >50% in bed, but not bedbound (Capable of only limited self-care, confined to bed or chair 50% or more of waking hours)  4 - Bedbound (Completely disabled. Cannot carry on any self-care. Totally confined to bed or chair)  5 - Death   Eustace Pen MM, Creech RH, Tormey DC, et al. [phone]). "Toxicity and response criteria of the [facility]". [place] Oncol. 5 (6): 649-55    LABORATORY DATA:  No results found for: WBC, HGB, HCT, MCV, PLT No results found for: NA, K, CL, CO2 No results found for: ALT, AST, GGT, ALKPHOS, BILITOT    RADIOGRAPHY: Mm Clip Placement Left  Result Date: 06/05/2017 CLINICAL DATA:  Stereotactic biopsy was performed a subtle asymmetry in the slightly outer left breast, felt to be likely benign tissue. As I believe that the asymmetry is most easily visualized in the CC projection, the stereotactic biopsy was approached from above with the patient in CC projection. EXAM: DIAGNOSTIC LEFT MAMMOGRAM POST STEREOTACTIC BIOPSY COMPARISON:  Previous exam(s). FINDINGS: Mammographic images were obtained following stereotactic guided biopsy of an asymmetry in the slightly outer and posterior third of the left breast. A coil shaped biopsy clip is satisfactorily positioned at the expected site of the biopsy, and is positioned  in the posterior third of the central slightly outer left breast. IMPRESSION: Satisfactory position of coil shaped biopsy clip. Final Assessment: Post Procedure Mammograms for Marker Placement Electronically Signed   By: Curlene Dolphin M.D.   On: 06/05/2017 11:07   Mm Lt Breast Bx W Loc Dev 1st Lesion Image Bx Spec Stereo Guide  Addendum Date: 06/06/2017   ADDENDUM REPORT: 06/06/2017 14:06 ADDENDUM: Pathology revealed FIBROCYSTIC CHANGES of the Left breast, upper outer posterior third. This was found to be concordant by Dr. Curlene Dolphin. Pathology results were discussed with the patient by telephone. The patient reported doing well after the biopsy with tenderness at the site. Post biopsy instructions and care were reviewed and questions were answered. The patient was encouraged to call [facility] for any additional concerns. The patient has a recent diagnosis of left breast cancer and should follow her outlined treatment plan. Pathology results reported by  Terie Purser, RN on 06/06/2017. Electronically Signed   By: Curlene Dolphin M.D.   On: 06/06/2017 14:06   Result Date: 06/06/2017 CLINICAL DATA:  67 year old patient was recently diagnosed with left breast cancer at the site of a small mass in the far superior left breast at 1 o'clock position. On her diagnostic mammogram of April 01, 2017, a probably benign asymmetry was also described in the outer left breast, likely asymmetric fibroglandular tissue, for which a six-month follow-up was recommended. Given that the patient is planning to undergo treatment for left breast cancer, attempt at stereotactic biopsy was desired of this asymmetry. EXAM: LEFT BREAST STEREOTACTIC CORE NEEDLE BIOPSY COMPARISON:  Previous exams. FINDINGS: The patient and I discussed the procedure of stereotactic-guided biopsy including benefits and alternatives. We discussed the high likelihood of a successful procedure. We discussed the risks of the procedure  including infection, bleeding, tissue injury, clip migration, and inadequate sampling. Informed written consent was given. The usual time out protocol was performed immediately prior to the procedure. Using sterile technique and 1% Lidocaine as local anesthetic, under stereotactic guidance, a 9 gauge vacuum assisted device was used to perform core needle biopsy of a subtle asymmetry in the posterior third of the outer left breast using a approach. Specimen radiograph was performed showing tissue. Lesion quadrant: Upper outer quadrant At the conclusion of the procedure, a coil shaped tissue marker clip was deployed into the biopsy cavity. Follow-up 2-view mammogram was performed and dictated separately. IMPRESSION: Stereotactic-guided biopsy of the left breast. No apparent complications. Electronically Signed: By: Curlene Dolphin M.D. On: 06/05/2017 11:13       IMPRESSION/PLAN: 1.  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 Dr. Lisbeth Renshaw discusses the pathology findings and reviews the nature of left breast disease. She is planning to proceed with breast conservation with lumpectomy with  sentinel node biopsy. Depending on the size of the final tumor measurements rendered by pathology, the tumor may be tested for Oncotype Dx score to determine a role for systemic therapy. Provided that chemotherapy is not indicated, the patient's course would then be followed by external radiotherapy. As above, she's decided to forgo antiestrogen therapy due to concerns about osteopenia/osteoperosis. We discussed the risks, benefits, short, and long term effects of radiotherapy, and the patient is interested in proceeding. Dr. Lisbeth Renshaw discusses the delivery and logistics of radiotherapy and anticipates a course of 4 or 6 1/2 weeks of radiotherapy. We will see her back about 2 weeks after surgery to discuss the simulation process and anticipate we starting radiotherapy about 4-6  weeks after surgery. She is in agreement to meet back.  In a visit lasting 90 minutes, greater than 50% of the time was spent face to face discussing her case, and coordinating the patient's care.  The above documentation reflects my direct findings during this shared patient visit. Please see the separate note by Dr. Lisbeth Renshaw on this date for the remainder of the patient's plan of care.    Carola Rhine, PAC

## 2017-06-12 ENCOUNTER — Other Ambulatory Visit: Payer: Self-pay | Admitting: General Surgery

## 2017-06-12 DIAGNOSIS — Z170 Estrogen receptor positive status [ER+]: Principal | ICD-10-CM

## 2017-06-12 DIAGNOSIS — C50412 Malignant neoplasm of upper-outer quadrant of left female breast: Secondary | ICD-10-CM

## 2017-06-13 ENCOUNTER — Ambulatory Visit
Admission: RE | Admit: 2017-06-13 | Discharge: 2017-06-13 | Disposition: A | Discharge status: Home / Self Care | Payer: BLUE CROSS/BLUE SHIELD | Source: Ambulatory Visit | Attending: General Surgery | Admitting: General Surgery

## 2017-06-13 DIAGNOSIS — C50412 Malignant neoplasm of upper-outer quadrant of left female breast: Secondary | ICD-10-CM

## 2017-06-13 DIAGNOSIS — Z170 Estrogen receptor positive status [ER+]: Principal | ICD-10-CM

## 2017-06-16 ENCOUNTER — Telehealth: Payer: Self-pay | Admitting: Hematology and Oncology

## 2017-06-16 NOTE — Telephone Encounter (Signed)
Left message for patient regarding upcoming June appointments per 5/31 sch message  °

## 2017-06-17 NOTE — Pre-Procedure Instructions (Signed)
Crystal Robles  06/17/2017      [id number] - [place], [place] - [address] [place] [address] Phone: [phone] Fax: [phone]    Your procedure is scheduled on June 7  Report to [facility] at 9:15 A.M.  Call this number if you have problems the morning of surgery:  [phone]   Remember:  No food after midnight.   You may drink clear liquids until 8:15 A.M..    Clear liquids allowed are:    Water, Juice (non-citric and without pulp), Carbonated beverages, Clear Tea, Black Coffee only, Plain Jell-O only, Gatorade and Plain Popsicles only     DRINK ENSURE BY 8:15 a.m. THE DAY OF SURGERY    Take these medicines the morning of surgery with A SIP OF WATER : FLONASE NASAL SPRAY IF NEEDED                7 days prior to surgery STOP taking any Aspirin(unless otherwise instructed by your surgeon), Aleve, Naproxen, Ibuprofen, Motrin, Advil, Goody's, BC's, all herbal medications, fish oil, and all vitamins    Do not wear jewelry, make-up or nail polish.  Do not wear lotions, powders, or perfumes, or deodorant.  Do not shave 48 hours prior to surgery.  Men may shave face and neck.  Do not bring valuables to the hospital.  [facility] is not responsible for any belongings or valuables.  Contacts, dentures or bridgework may not be worn into surgery.  Leave your suitcase in the car.  After surgery it may be brought to your room.  For patients admitted to the hospital, discharge time will be determined by your treatment team.  Patients discharged the day of surgery will not be allowed to drive home.    Special instructions:   [facility]- Preparing For Surgery  Before surgery, you can play an important role. Because skin is not sterile, your skin needs to be as free of germs as possible. You can reduce the number of germs on your skin by washing with CHG (chlorahexidine  gluconate) Soap before surgery.  CHG is an antiseptic cleaner which kills germs and bonds with the skin to continue killing germs even after washing.    Oral Hygiene is also important to reduce your risk of infection.  Remember - BRUSH YOUR TEETH THE MORNING OF SURGERY WITH YOUR REGULAR TOOTHPASTE  Please do not use if you have an allergy to CHG or antibacterial soaps. If your skin becomes reddened/irritated stop using the CHG.  Do not shave (including legs and underarms) for at least 48 hours prior to first CHG shower. It is OK to shave your face.  Please follow these instructions carefully.   1. Shower the NIGHT BEFORE SURGERY and the MORNING OF SURGERY with CHG.   2. If you chose to wash your hair, wash your hair first as usual with your normal shampoo.  3. After you shampoo, rinse your hair and body thoroughly to remove the shampoo.  4. Use CHG as you would any other liquid soap. You can apply CHG directly to the skin and wash gently with a scrungie or a clean washcloth.   5. Apply the CHG Soap to your body ONLY FROM THE NECK DOWN.  Do not use on open wounds or open sores. Avoid contact with your eyes, ears, mouth and genitals (private parts). Wash Face and genitals (private parts)  with your normal soap.  6. Wash thoroughly, paying special attention to the area where your surgery will be performed.  7. Thoroughly rinse your body with warm water from the neck down.  8. DO NOT shower/wash with your normal soap after using and rinsing off the CHG Soap.  9. Pat yourself dry with a CLEAN TOWEL.  10. Wear CLEAN PAJAMAS to bed the night before surgery, wear comfortable clothes the morning of surgery  11. Place CLEAN SHEETS on your bed the night of your first shower and DO NOT SLEEP WITH PETS.    Day of Surgery:  Do not apply any deodorants/lotions.  Please wear clean clothes to the hospital/surgery center.   Remember to brush your teeth WITH YOUR REGULAR TOOTHPASTE.    Please  read over the following fact sheets that you were given. Coughing and Deep Breathing and Surgical Site Infection Prevention

## 2017-06-18 ENCOUNTER — Encounter (HOSPITAL_COMMUNITY): Payer: Self-pay

## 2017-06-18 ENCOUNTER — Encounter (HOSPITAL_COMMUNITY)
Admission: RE | Admit: 2017-06-18 | Discharge: 2017-06-18 | Disposition: A | Discharge status: Home / Self Care | Payer: BLUE CROSS/BLUE SHIELD | Source: Ambulatory Visit | Attending: General Surgery | Admitting: General Surgery

## 2017-06-18 ENCOUNTER — Other Ambulatory Visit: Payer: Self-pay

## 2017-06-18 DIAGNOSIS — Z01812 Encounter for preprocedural laboratory examination: Secondary | ICD-10-CM | POA: Insufficient documentation

## 2017-06-18 DIAGNOSIS — Z01818 Encounter for other preprocedural examination: Secondary | ICD-10-CM | POA: Insufficient documentation

## 2017-06-18 DIAGNOSIS — Z7951 Long term (current) use of inhaled steroids: Secondary | ICD-10-CM | POA: Diagnosis not present

## 2017-06-18 DIAGNOSIS — C50912 Malignant neoplasm of unspecified site of left female breast: Secondary | ICD-10-CM | POA: Diagnosis not present

## 2017-06-18 DIAGNOSIS — I471 Supraventricular tachycardia: Secondary | ICD-10-CM | POA: Insufficient documentation

## 2017-06-18 DIAGNOSIS — Z79899 Other long term (current) drug therapy: Secondary | ICD-10-CM | POA: Insufficient documentation

## 2017-06-18 HISTORY — DX: Other specified postprocedural states: Z98.890

## 2017-06-18 HISTORY — DX: Malignant (primary) neoplasm, unspecified: C80.1

## 2017-06-18 HISTORY — DX: Nausea with vomiting, unspecified: R11.2

## 2017-06-18 LAB — CBC
HEMATOCRIT: 42.4 % (ref 36.0–46.0)
HEMOGLOBIN: 13.5 g/dL (ref 12.0–15.0)
MCH: 28.1 pg (ref 26.0–34.0)
MCHC: 31.8 g/dL (ref 30.0–36.0)
MCV: 88.1 fL (ref 78.0–100.0)
Platelets: 296 10*3/uL (ref 150–400)
RBC: 4.81 MIL/uL (ref 3.87–5.11)
RDW: 13.2 % (ref 11.5–15.5)
WBC: 6.2 10*3/uL (ref 4.0–10.5)

## 2017-06-18 LAB — BASIC METABOLIC PANEL
ANION GAP: 7 (ref 5–15)
BUN: 14 mg/dL (ref 6–20)
CHLORIDE: 107 mmol/L (ref 101–111)
CO2: 24 mmol/L (ref 22–32)
Calcium: 9.4 mg/dL (ref 8.9–10.3)
Creatinine, Ser: 0.89 mg/dL (ref 0.44–1.00)
GFR calc Af Amer: >60 mL/min (ref >60)
GFR calc non Af Amer: >60 mL/min (ref >60)
Glucose, Bld: 103 mg/dL — ABNORMAL HIGH (ref 65–99)
POTASSIUM: 4.1 mmol/L (ref 3.5–5.1)
Sodium: 138 mmol/L (ref 135–145)

## 2017-06-18 NOTE — Progress Notes (Signed)
PATIENT STATES SHE DOES NOT HAVE A CARDIOLOGIST.  SHE HAS HX OF PAT- NOT ON ANY MEDS.  PATIENT STATES HER PCP DR. BURNETTE IN [place] HAS RETIRED AND SHE IS NOT SEEING ANYONE CURRENTLY.

## 2017-06-19 ENCOUNTER — Ambulatory Visit
Admission: RE | Admit: 2017-06-19 | Discharge: 2017-06-19 | Disposition: A | Discharge status: Home / Self Care | Payer: BLUE CROSS/BLUE SHIELD | Source: Ambulatory Visit | Attending: General Surgery | Admitting: General Surgery

## 2017-06-19 DIAGNOSIS — Z170 Estrogen receptor positive status [ER+]: Principal | ICD-10-CM

## 2017-06-19 DIAGNOSIS — C50412 Malignant neoplasm of upper-outer quadrant of left female breast: Secondary | ICD-10-CM

## 2017-06-19 NOTE — Progress Notes (Signed)
Anesthesia Chart Review:   Case:  [id number] Date/Time:  06/20/17 1100   Procedure:  LEFT BREAST LUMPECTOMY WITH RADIOACTIVE SEED AND SENTINEL LYMPH NODE BIOPSY (Left Breast)   Anesthesia type:  General   Pre-op diagnosis:  LEFT BREAST CANCER   Location:  [facility] OR ROOM 08 / [facility]   Surgeon:  Rolm Bookbinder, MD      DISCUSSION: - Pt is a 67 year old female with hx paroxysmal atrial tachycardia - Pt does not see cardiology.  I attempted to reach out to pt for details on PAT hx, but was unsuccessful.  - EKG at pre-admission testing showed sinus bradycardia (57 bpm)   VS: BP (!) 117/50   Pulse 62   Temp 36.7 C   Resp 18   Ht 5\' 4"  (1.626 m)   Wt 145 lb 11.2 oz (66.1 kg)   SpO2 100%   BMI 25.01 kg/m    PROVIDERS: Receives primary care at [facility], [place]: Labs reviewed: Acceptable for surgery. (all labs ordered are listed, but only abnormal results are displayed)  Labs Reviewed  BASIC METABOLIC PANEL - Abnormal; Notable for the following components:      Result Value   Glucose, Bld 103 (*)    All other components within normal limits  CBC    EKG: Sinus bradycardia (57 bpm)   CV:  Past Medical History:  Diagnosis Date  . Cancer ([place])    BREAST CA   . PAT (paroxysmal atrial tachycardia) ([place])   . PONV (postoperative nausea and vomiting)     Past Surgical History:  Procedure Laterality Date  . CESAREAN SECTION  1993  . WRIST SURGERY Left 07/2003    MEDICATIONS: . azithromycin (ZITHROMAX) 250 MG tablet  . calcium carbonate (CALCIUM 600) 600 MG TABS tablet  . Calcium-Magnesium-Vitamin D (CALCIUM MAGNESIUM PO)  . Cholecalciferol (VITAMIN D-1000 MAX ST) 1000 units tablet  . Coenzyme Q10 (COQ10) 100 MG CAPS  . estradiol (VIVELLE-DOT) 0.075 MG/24HR  . fluticasone (FLONASE) 50 MCG/ACT nasal spray  . ibuprofen (ADVIL,MOTRIN) 200 MG tablet  . nitroGLYCERIN (NITRODUR - DOSED IN MG/24 HR) 0.2 mg/hr patch  . Omega-3 Fatty Acids (FISH  OIL) 1000 MG CAPS  . progesterone (PROMETRIUM) 100 MG capsule  . vitamin C (ASCORBIC ACID) 500 MG tablet  . zinc gluconate 50 MG tablet   No current facility-administered medications for this encounter.     If no changes, I anticipate pt can proceed with surgery as scheduled.   Willeen Cass, FNP-BC [facility]/Anesthesiology Phone: [phone] 06/19/2017 2:33 PM

## 2017-06-20 ENCOUNTER — Ambulatory Visit
Admission: RE | Admit: 2017-06-20 | Discharge: 2017-06-20 | Disposition: A | Discharge status: Home / Self Care | Payer: BLUE CROSS/BLUE SHIELD | Source: Ambulatory Visit | Attending: General Surgery | Admitting: General Surgery

## 2017-06-20 ENCOUNTER — Ambulatory Visit (HOSPITAL_COMMUNITY)
Admission: RE | Admit: 2017-06-20 | Discharge: 2017-06-20 | Disposition: A | Discharge status: Home / Self Care | Payer: BLUE CROSS/BLUE SHIELD | Source: Ambulatory Visit | Attending: General Surgery | Admitting: General Surgery

## 2017-06-20 ENCOUNTER — Encounter (HOSPITAL_COMMUNITY): Payer: Self-pay

## 2017-06-20 ENCOUNTER — Ambulatory Visit (HOSPITAL_COMMUNITY): Payer: BLUE CROSS/BLUE SHIELD | Admitting: Anesthesiology

## 2017-06-20 ENCOUNTER — Encounter (HOSPITAL_COMMUNITY)
Admission: RE | Disposition: A | Discharge status: Home / Self Care | Payer: Self-pay | Source: Ambulatory Visit | Attending: General Surgery

## 2017-06-20 ENCOUNTER — Ambulatory Visit (HOSPITAL_COMMUNITY): Payer: BLUE CROSS/BLUE SHIELD | Admitting: Emergency Medicine

## 2017-06-20 DIAGNOSIS — C50412 Malignant neoplasm of upper-outer quadrant of left female breast: Secondary | ICD-10-CM | POA: Diagnosis not present

## 2017-06-20 DIAGNOSIS — Z170 Estrogen receptor positive status [ER+]: Secondary | ICD-10-CM | POA: Diagnosis not present

## 2017-06-20 DIAGNOSIS — Z79899 Other long term (current) drug therapy: Secondary | ICD-10-CM | POA: Diagnosis not present

## 2017-06-20 DIAGNOSIS — E7800 Pure hypercholesterolemia, unspecified: Secondary | ICD-10-CM | POA: Insufficient documentation

## 2017-06-20 HISTORY — PX: BREAST LUMPECTOMY WITH RADIOACTIVE SEED AND SENTINEL LYMPH NODE BIOPSY: SHX6550

## 2017-06-20 SURGERY — BREAST LUMPECTOMY WITH RADIOACTIVE SEED AND SENTINEL LYMPH NODE BIOPSY
Anesthesia: General | Site: Breast | Laterality: Left

## 2017-06-20 MED ORDER — CIPROFLOXACIN IN D5W 400 MG/200ML IV SOLN
400.0000 mg | INTRAVENOUS | Status: AC
  Administered 2017-06-20: 400 mg via INTRAVENOUS

## 2017-06-20 MED ORDER — PROPOFOL 10 MG/ML IV BOLUS
INTRAVENOUS | Status: DC | PRN
  Administered 2017-06-20: 150 mg via INTRAVENOUS

## 2017-06-20 MED ORDER — MIDAZOLAM HCL 2 MG/2ML IJ SOLN
INTRAMUSCULAR | Status: AC
  Administered 2017-06-20: 2 mg via INTRAVENOUS
  Filled 2017-06-20: qty 2

## 2017-06-20 MED ORDER — TRAMADOL HCL 50 MG PO TABS: 100.0000 mg | ORAL_TABLET | Freq: Four times a day (QID) | ORAL | 0 refills | Status: AC | PRN

## 2017-06-20 MED ORDER — TECHNETIUM TC 99M SULFUR COLLOID FILTERED
1.0000 | Freq: Once | INTRAVENOUS | Status: AC | PRN
  Administered 2017-06-20: 1 via INTRADERMAL

## 2017-06-20 MED ORDER — CHLORHEXIDINE GLUCONATE CLOTH 2 % EX PADS: 6.0000 | MEDICATED_PAD | Freq: Once | CUTANEOUS | Status: DC

## 2017-06-20 MED ORDER — FENTANYL CITRATE (PF) 250 MCG/5ML IJ SOLN
INTRAMUSCULAR | Status: AC
  Filled 2017-06-20: qty 5

## 2017-06-20 MED ORDER — BUPIVACAINE-EPINEPHRINE 0.25% -1:200000 IJ SOLN
INTRAMUSCULAR | Status: DC | PRN
  Administered 2017-06-20: 10 mL

## 2017-06-20 MED ORDER — GABAPENTIN 300 MG PO CAPS
300.0000 mg | ORAL_CAPSULE | ORAL | Status: AC
  Administered 2017-06-20: 300 mg via ORAL

## 2017-06-20 MED ORDER — DEXAMETHASONE SODIUM PHOSPHATE 10 MG/ML IJ SOLN
INTRAMUSCULAR | Status: DC | PRN
  Administered 2017-06-20: 8 mg via INTRAVENOUS

## 2017-06-20 MED ORDER — GABAPENTIN 300 MG PO CAPS
ORAL_CAPSULE | ORAL | Status: AC
  Administered 2017-06-20: 300 mg via ORAL
  Filled 2017-06-20: qty 1

## 2017-06-20 MED ORDER — ACETAMINOPHEN 500 MG PO TABS
ORAL_TABLET | ORAL | Status: AC
  Administered 2017-06-20: 1000 mg via ORAL
  Filled 2017-06-20: qty 2

## 2017-06-20 MED ORDER — KETOROLAC TROMETHAMINE 15 MG/ML IJ SOLN
INTRAMUSCULAR | Status: AC
  Administered 2017-06-20: 15 mg via INTRAVENOUS
  Filled 2017-06-20: qty 1

## 2017-06-20 MED ORDER — SCOPOLAMINE 1 MG/3DAYS TD PT72
MEDICATED_PATCH | TRANSDERMAL | Status: AC
  Filled 2017-06-20: qty 1

## 2017-06-20 MED ORDER — FENTANYL CITRATE (PF) 250 MCG/5ML IJ SOLN
INTRAMUSCULAR | Status: DC | PRN
  Administered 2017-06-20: 50 ug via INTRAVENOUS

## 2017-06-20 MED ORDER — ACETAMINOPHEN 500 MG PO TABS
1000.0000 mg | ORAL_TABLET | ORAL | Status: AC
  Administered 2017-06-20: 1000 mg via ORAL

## 2017-06-20 MED ORDER — LIDOCAINE HCL (CARDIAC) PF 100 MG/5ML IV SOSY
PREFILLED_SYRINGE | INTRAVENOUS | Status: DC | PRN
  Administered 2017-06-20: 40 mg via INTRATRACHEAL

## 2017-06-20 MED ORDER — MEPERIDINE HCL 50 MG/ML IJ SOLN: 6.2500 mg | INTRAMUSCULAR | Status: DC | PRN

## 2017-06-20 MED ORDER — MIDAZOLAM HCL 2 MG/2ML IJ SOLN
2.0000 mg | Freq: Once | INTRAMUSCULAR | Status: AC
  Administered 2017-06-20: 2 mg via INTRAVENOUS

## 2017-06-20 MED ORDER — ENSURE PRE-SURGERY PO LIQD
296.0000 mL | Freq: Once | ORAL | Status: DC
  Filled 2017-06-20: qty 296

## 2017-06-20 MED ORDER — METHYLENE BLUE 0.5 % INJ SOLN
INTRAVENOUS | Status: AC
  Filled 2017-06-20: qty 10

## 2017-06-20 MED ORDER — BUPIVACAINE-EPINEPHRINE (PF) 0.25% -1:200000 IJ SOLN
INTRAMUSCULAR | Status: AC
  Filled 2017-06-20: qty 30

## 2017-06-20 MED ORDER — KETOROLAC TROMETHAMINE 15 MG/ML IJ SOLN
15.0000 mg | INTRAMUSCULAR | Status: AC
  Administered 2017-06-20: 15 mg via INTRAVENOUS

## 2017-06-20 MED ORDER — PROPOFOL 10 MG/ML IV BOLUS
INTRAVENOUS | Status: AC
  Filled 2017-06-20: qty 20

## 2017-06-20 MED ORDER — FENTANYL CITRATE (PF) 100 MCG/2ML IJ SOLN
INTRAMUSCULAR | Status: AC
  Administered 2017-06-20: 50 ug via INTRAVENOUS
  Filled 2017-06-20: qty 2

## 2017-06-20 MED ORDER — HYDROMORPHONE HCL 2 MG/ML IJ SOLN: 0.2500 mg | INTRAMUSCULAR | Status: DC | PRN

## 2017-06-20 MED ORDER — ROPIVACAINE HCL 5 MG/ML IJ SOLN
INTRAMUSCULAR | Status: DC | PRN
  Administered 2017-06-20: 30 mL via PERINEURAL

## 2017-06-20 MED ORDER — 0.9 % SODIUM CHLORIDE (POUR BTL) OPTIME
TOPICAL | Status: DC | PRN
  Administered 2017-06-20: 1000 mL

## 2017-06-20 MED ORDER — SCOPOLAMINE 1 MG/3DAYS TD PT72
MEDICATED_PATCH | TRANSDERMAL | Status: DC | PRN
  Administered 2017-06-20: 1 via TRANSDERMAL

## 2017-06-20 MED ORDER — CIPROFLOXACIN IN D5W 400 MG/200ML IV SOLN
INTRAVENOUS | Status: AC
  Filled 2017-06-20: qty 200

## 2017-06-20 MED ORDER — LACTATED RINGERS IV SOLN
INTRAVENOUS | Status: DC | PRN
  Administered 2017-06-20: 09:00:00 via INTRAVENOUS

## 2017-06-20 MED ORDER — ONDANSETRON HCL 4 MG/2ML IJ SOLN
INTRAMUSCULAR | Status: DC | PRN
  Administered 2017-06-20: 4 mg via INTRAVENOUS

## 2017-06-20 MED ORDER — OXYCODONE HCL 5 MG PO TABS: 5.0000 mg | ORAL_TABLET | Freq: Once | ORAL | Status: DC | PRN

## 2017-06-20 MED ORDER — PROMETHAZINE HCL 25 MG/ML IJ SOLN: 6.2500 mg | INTRAMUSCULAR | Status: DC | PRN

## 2017-06-20 MED ORDER — OXYCODONE HCL 5 MG/5ML PO SOLN: 5.0000 mg | Freq: Once | ORAL | Status: DC | PRN

## 2017-06-20 MED ORDER — EPHEDRINE SULFATE 50 MG/ML IJ SOLN
INTRAMUSCULAR | Status: DC | PRN
  Administered 2017-06-20: 5 mg via INTRAVENOUS
  Administered 2017-06-20: 10 mg via INTRAVENOUS
  Administered 2017-06-20: 5 mg via INTRAVENOUS
  Administered 2017-06-20 (×2): 10 mg via INTRAVENOUS

## 2017-06-20 MED ORDER — FENTANYL CITRATE (PF) 100 MCG/2ML IJ SOLN
100.0000 ug | Freq: Once | INTRAMUSCULAR | Status: AC
  Administered 2017-06-20: 50 ug via INTRAVENOUS

## 2017-06-20 SURGICAL SUPPLY — 55 items
ADH SKN CLS APL DERMABOND .7 (GAUZE/BANDAGES/DRESSINGS) ×1
APPLIER CLIP 9.375 MED OPEN (MISCELLANEOUS) ×3
APR CLP MED 9.3 20 MLT OPN (MISCELLANEOUS) ×1
BINDER BREAST XLRG (GAUZE/BANDAGES/DRESSINGS) ×2 IMPLANT
BLADE SURG 15 STRL LF DISP TIS (BLADE) ×1 IMPLANT
BLADE SURG 15 STRL SS (BLADE) ×3
CANISTER SUCT 3000ML PPV (MISCELLANEOUS) ×3 IMPLANT
CHLORAPREP W/TINT 26ML (MISCELLANEOUS) ×3 IMPLANT
CLIP APPLIE 9.375 MED OPEN (MISCELLANEOUS) ×1 IMPLANT
CLOSURE STERI-STRIP 1/2X4 (GAUZE/BANDAGES/DRESSINGS) ×1
CLOSURE WOUND 1/2 X4 (GAUZE/BANDAGES/DRESSINGS) ×1
CLSR STERI-STRIP ANTIMIC 1/2X4 (GAUZE/BANDAGES/DRESSINGS) ×1 IMPLANT
COVER PROBE W GEL 5X96 (DRAPES) ×3 IMPLANT
COVER SURGICAL LIGHT HANDLE (MISCELLANEOUS) ×3 IMPLANT
DERMABOND ADVANCED (GAUZE/BANDAGES/DRESSINGS) ×2
DERMABOND ADVANCED .7 DNX12 (GAUZE/BANDAGES/DRESSINGS) ×1 IMPLANT
DEVICE DUBIN SPECIMEN MAMMOGRA (MISCELLANEOUS) ×3 IMPLANT
DRAPE CHEST BREAST 15X10 FENES (DRAPES) ×3 IMPLANT
DRAPE UTILITY XL STRL (DRAPES) ×3 IMPLANT
ELECT COATED BLADE 2.86 ST (ELECTRODE) ×3 IMPLANT
ELECT REM PT RETURN 9FT ADLT (ELECTROSURGICAL) ×3
ELECTRODE REM PT RTRN 9FT ADLT (ELECTROSURGICAL) ×1 IMPLANT
GLOVE BIO SURGEON STRL SZ7 (GLOVE) ×3 IMPLANT
GLOVE BIOGEL PI IND STRL 7.5 (GLOVE) ×1 IMPLANT
GLOVE BIOGEL PI INDICATOR 7.5 (GLOVE) ×2
GOWN STRL REUS W/ TWL LRG LVL3 (GOWN DISPOSABLE) ×2 IMPLANT
GOWN STRL REUS W/TWL LRG LVL3 (GOWN DISPOSABLE) ×6
ILLUMINATOR WAVEGUIDE N/F (MISCELLANEOUS) IMPLANT
KIT BASIN OR (CUSTOM PROCEDURE TRAY) ×3 IMPLANT
KIT MARKER MARGIN INK (KITS) ×3 IMPLANT
MARKER SKIN DUAL TIP RULER LAB (MISCELLANEOUS) ×3 IMPLANT
NDL FILTER BLUNT 18X1 1/2 (NEEDLE) IMPLANT
NDL HYPO 25GX1X1/2 BEV (NEEDLE) ×1 IMPLANT
NDL SAFETY ECLIPSE 18X1.5 (NEEDLE) IMPLANT
NEEDLE FILTER BLUNT 18X 1/2SAF (NEEDLE)
NEEDLE FILTER BLUNT 18X1 1/2 (NEEDLE) IMPLANT
NEEDLE HYPO 18GX1.5 SHARP (NEEDLE)
NEEDLE HYPO 25GX1X1/2 BEV (NEEDLE) ×3 IMPLANT
NS IRRIG 1000ML POUR BTL (IV SOLUTION) ×3 IMPLANT
PACK SURGICAL SETUP 50X90 (CUSTOM PROCEDURE TRAY) ×3 IMPLANT
PENCIL BUTTON HOLSTER BLD 10FT (ELECTRODE) ×3 IMPLANT
SPONGE LAP 18X18 X RAY DECT (DISPOSABLE) ×3 IMPLANT
STRIP CLOSURE SKIN 1/2X4 (GAUZE/BANDAGES/DRESSINGS) ×2 IMPLANT
SUT MNCRL AB 4-0 PS2 18 (SUTURE) ×6 IMPLANT
SUT SILK 2 0 SH (SUTURE) ×2 IMPLANT
SUT VIC AB 2-0 SH 27 (SUTURE) ×6
SUT VIC AB 2-0 SH 27XBRD (SUTURE) ×2 IMPLANT
SUT VIC AB 3-0 SH 27 (SUTURE) ×6
SUT VIC AB 3-0 SH 27X BRD (SUTURE) ×2 IMPLANT
SYR BULB 3OZ (MISCELLANEOUS) ×3 IMPLANT
SYR CONTROL 10ML LL (SYRINGE) ×3 IMPLANT
TOWEL OR 17X24 6PK STRL BLUE (TOWEL DISPOSABLE) ×3 IMPLANT
TUBE CONNECTING 12'X1/4 (SUCTIONS) ×1
TUBE CONNECTING 12X1/4 (SUCTIONS) ×2 IMPLANT
YANKAUER SUCT BULB TIP NO VENT (SUCTIONS) ×3 IMPLANT

## 2017-06-20 NOTE — Transfer of Care (Signed)
Immediate Anesthesia Transfer of Care Note  Patient: Crystal Robles  Procedure(s) Performed: LEFT BREAST LUMPECTOMY WITH RADIOACTIVE SEED AND SENTINEL LYMPH NODE BIOPSY (Left Breast)  Patient Location: PACU  Anesthesia Type:GA combined with regional for post-op pain  Level of Consciousness: awake, alert  and oriented  Airway & Oxygen Therapy: Patient Spontanous Breathing and Patient connected to nasal cannula oxygen  Post-op Assessment: Report given to RN, Post -op Vital signs reviewed and stable and Patient moving all extremities X 4  Post vital signs: Reviewed and stable  Last Vitals:  Vitals Value Taken Time  BP 112/56 06/20/2017 11:37 AM  Temp    Pulse 68 06/20/2017 11:38 AM  Resp    SpO2 100 % 06/20/2017 11:38 AM  Vitals shown include unvalidated device data.  Last Pain:  Vitals:   06/20/17 0904  TempSrc: Oral      Patients Stated Pain Goal: 3 (60/73/71 0626)  Complications: No apparent anesthesia complications

## 2017-06-20 NOTE — Anesthesia Preprocedure Evaluation (Signed)
Anesthesia Evaluation  Patient identified by MRN, date of birth, ID band Patient awake    Reviewed: Allergy & Precautions, NPO status , Patient's Chart, lab work & pertinent test results  History of Anesthesia Complications (+) PONV  Airway Mallampati: II  TM Distance: >3 FB Neck ROM: Full    Dental no notable dental hx.    Pulmonary neg pulmonary ROS,    Pulmonary exam normal breath sounds clear to auscultation       Cardiovascular negative cardio ROS Normal cardiovascular exam Rhythm:Regular Rate:Normal     Neuro/Psych negative neurological ROS  negative psych ROS   GI/Hepatic negative GI ROS, Neg liver ROS,   Endo/Other  negative endocrine ROS  Renal/GU negative Renal ROS  negative genitourinary   Musculoskeletal negative musculoskeletal ROS (+)   Abdominal   Peds negative pediatric ROS (+)  Hematology negative hematology ROS (+)   Anesthesia Other Findings Breast Cancer  Reproductive/Obstetrics negative OB ROS                             Anesthesia Physical Anesthesia Plan  ASA: III  Anesthesia Plan: General   Post-op Pain Management: GA combined w/ Regional for post-op pain   Induction: Intravenous  PONV Risk Score and Plan: 4 or greater and Ondansetron, Dexamethasone, Midazolam and Scopolamine patch - Pre-op  Airway Management Planned: Oral ETT  Additional Equipment:   Intra-op Plan:   Post-operative Plan: Extubation in OR  Informed Consent: I have reviewed the patients History and Physical, chart, labs and discussed the procedure including the risks, benefits and alternatives for the proposed anesthesia with the patient or authorized representative who has indicated his/her understanding and acceptance.   Dental advisory given  Plan Discussed with: CRNA  Anesthesia Plan Comments:         Anesthesia Quick Evaluation

## 2017-06-20 NOTE — H&P (Signed)
67 yof referred by Dr Helane Rima for new left breast cancer. 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 she lives in [place] with her husband. they will move at some point near [facility] she has no personal history of breast disease. no fh of breast cancer. she had no mass or dc. underwent screening mm that shows b density breasts. the right is normal. the left shows possible asymmetries. the possible asymmetry on the central left breast does not persist and likely represents overlapping fibroglandular tissue. on spot cc image there is focal area of abnormal architecture. this is not really changed from prior 2d images. US shows a hypoechoic mass at 1 oclock left breast 7 cm from nipple measuring 7x7x9 mm. [place] over remainder of luoq is negative. [place] left axilla is also negative. she was recommended US guided biopsy of mass and follow up of asymetry. she underwent US guided biopsy and clip in good position. 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 she is here with her husband to discuss options. she does have history of svt in past.  The asymmetry has been biopsied now and is benign concordant.  Past Surgical History  Breast Biopsy  Left. Cesarean Section - 1  Colon Polyp Removal - Colonoscopy   Diagnostic Studies History  Colonoscopy  1-5 years ago Mammogram  within last year Pap Smear  1-5 years ago  Allergies Levonne Spiller, CMA; 05/23/2017 10:44 AM) Codeine/Codeine Derivatives  Penicillins  Avelox *FLUOROQUINOLONES*  Anesthesia S/I-40 *GENERAL ANESTHETICS*  Allergies Reconciled   Medication History (Danielle Gerrigner, CMA; 05/23/2017 10:44 AM) Prometrium (100MG  Capsule, Oral) Active. Vivelle-Dot (0.075MG /24HR Patch TW, Transdermal) Active. Medications Reconciled  Pregnancy / Birth History Levonne Spiller, CMA; 05/23/2017 10:41 AM) Age at menarche  67 years. Age of  menopause  53-55 Contraceptive History  Oral contraceptives. Gravida  1 Length (months) of breastfeeding  3-6 Maternal age  13-40 Para  2  Other Problems Levonne Spiller, [place]; 05/23/2017 10:41 AM) Back Pain  Breast Cancer  General anesthesia - complications  Hypercholesterolemia    Review of Systems Andee Poles Gerrigner CMA; 05/23/2017 10:41 AM) General Not Present- Appetite Loss, Chills, Fatigue, Fever, Night Sweats, Weight Gain and Weight Loss. Skin Not Present- Change in Wart/Mole, Dryness, Hives, Jaundice, New Lesions, Non-Healing Wounds, Rash and Ulcer. HEENT Present- Seasonal Allergies. Not Present- Earache, Hearing Loss, Hoarseness, Nose Bleed, Oral Ulcers, Ringing in the Ears, Sinus Pain, Sore Throat, Visual Disturbances, Wears glasses/contact lenses and Yellow Eyes. Respiratory Not Present- Bloody sputum, Chronic Cough, Difficulty Breathing, Snoring and Wheezing. Breast Not Present- Breast Mass, Breast Pain, Nipple Discharge and Skin Changes. Cardiovascular Not Present- Chest Pain, Difficulty Breathing Lying Down, Leg Cramps, Palpitations, Rapid Heart Rate, Shortness of Breath and Swelling of Extremities. Gastrointestinal Not Present- Abdominal Pain, Bloating, Bloody Stool, Change in Bowel Habits, Chronic diarrhea, Constipation, Difficulty Swallowing, Excessive gas, Gets full quickly at meals, Hemorrhoids, Indigestion, Nausea, Rectal Pain and Vomiting. Female Genitourinary Not Present- Frequency, Nocturia, Painful Urination, Pelvic Pain and Urgency. Musculoskeletal Present- Back Pain. Not Present- Joint Pain, Joint Stiffness, Muscle Pain, Muscle Weakness and Swelling of Extremities. Neurological Not Present- Decreased Memory, Fainting, Headaches, Numbness, Seizures, Tingling, Tremor, Trouble walking and Weakness. Psychiatric Not Present- Anxiety, Bipolar, Change in Sleep Pattern, Depression, Fearful and Frequent crying. Endocrine Not Present- Cold Intolerance, Excessive  Hunger, Hair Changes, Heat Intolerance, Hot flashes and New Diabetes. Hematology Not Present- Blood Thinners, Easy  Bruising, Excessive bleeding, Gland problems, HIV and Persistent Infections.  Vitals Andee Poles Gerrigner CMA; 05/23/2017 10:45 AM) 05/23/2017 10:44 AM Weight: 146.13 lb Height: 64in Body Surface Area: 1.71 m Body Mass Index: 25.08 kg/m  Temp.: 97.75F(Oral)  Pulse: 61 (Regular)  BP: 118/80 (Sitting, Right Arm, Standard) Physical Exam Rolm Bookbinder MD; 05/23/2017 4:55 PM) General Mental Status-Alert. Head and Neck Trachea-midline. Thyroid Gland Characteristics - normal size and consistency. Eye Sclera/Conjunctiva - Bilateral-No scleral icterus. Chest and Lung Exam Chest and lung exam reveals -quiet, even and easy respiratory effort with no use of accessory muscles and on auscultation, normal breath sounds, no adventitious sounds and normal vocal resonance. Breast Nipples-No Discharge. Breast Lump-No Palpable Breast Mass. Cardiovascular Cardiovascular examination reveals -normal heart sounds, regular rate and rhythm with no murmurs. Neurologic Neurologic evaluation reveals -alert and oriented x 3 with no impairment of recent or remote memory. Lymphatic Head & Neck General Head & Neck Lymphatics: Bilateral - Description - Normal. Axillary General Axillary Region: Bilateral - Description - Normal. Note: no [place] adenopathy   Assessment & Plan Rolm Bookbinder MD; 05/23/2017 5:03 PM) BREAST CANCER OF UPPER-OUTER QUADRANT OF LEFT FEMALE BREAST (C50.412) Story: left breast seed guided lumpectomy, left ax sn biopsy We discussed the staging and pathophysiology of breast cancer. We discussed all of the different options for treatment for breast cancer including surgery, chemotherapy, radiation therapy, Herceptin, and antiestrogen therapy. We discussed a sentinel lymph node biopsy as she does not appear to having lymph node involvement right  now. We discussed the performance of that with injection of radioactive tracer. We discussed that there is a chance of having a positive node with a sentinel lymph node biopsy and we will await the permanent pathology to make any other first further decisions in terms of her treatment.We discussed up to a 5% risk lifetime of chronic shoulder pain as well as lymphedema associated with a sentinel lymph node biopsy. We discussed the options for treatment of the breast cancer which included lumpectomy versus a mastectomy. We discussed the performance of the lumpectomy with radioactive seed placement. We discussed a 5% chance of a positive margin requiring reexcision in the operating room. We also discussed that she will be recommended radiation therapy if she undergoes lumpectomy. We discussed mastectomy and the postoperative care for that as well.The decision for lumpectomy vs mastectomy has no impact on decision for chemotherapy. Most mastectomy patients will not need radiation therapy. We discussed that there is no difference in her survival whether she undergoes lumpectomy with radiation therapy or antiestrogen therapy versus a mastectomy. There is also no real difference between her recurrence in the breast. We discussed the risks of operation including bleeding, infection, possible reoperation.

## 2017-06-20 NOTE — Anesthesia Procedure Notes (Signed)
Anesthesia Regional Block: Pectoralis block   Pre-Anesthetic Checklist: ,, timeout performed, Correct Patient, Correct Site, Correct Laterality, Correct Procedure, Correct Position, site marked, Risks and benefits discussed,  Surgical consent,  Pre-op evaluation,  At surgeon's request and post-op pain management  Laterality: Left  Prep: chloraprep       Needles:  Injection technique: Single-shot  Needle Type: Stimiplex     Needle Length: 9cm  Needle Gauge: 21     Additional Needles:   Procedures:,,,, ultrasound used (permanent image in chart),,,,  Narrative:  Start time: 06/20/2017 10:10 AM End time: 06/20/2017 10:15 AM Injection made incrementally with aspirations every 5 mL.  Performed by: Personally  Anesthesiologist: Lynda Rainwater, MD

## 2017-06-20 NOTE — Discharge Instructions (Signed)
[facility] Office Phone Number [phone]   POST OP INSTRUCTIONS Take either tylenol 650 mg every 6 hours or ibuprofen 400 mg every 8 hours for next 3 days and then take as needed. Use ice today and then twice daily for the next 3 days.   Always review your discharge instruction sheet given to you by the facility where your surgery was performed.  IF YOU HAVE DISABILITY OR FAMILY LEAVE FORMS, YOU MUST BRING THEM TO THE OFFICE FOR PROCESSING.  DO NOT GIVE THEM TO YOUR DOCTOR.  1. A prescription for pain medication may be given to you upon discharge.  Take your pain medication as prescribed, if needed.  If narcotic pain medicine is not needed, then you may take acetaminophen (Tylenol), naprosyn (Alleve) or ibuprofen (Advil) as needed. 2. Take your usually prescribed medications unless otherwise directed 3. If you need a refill on your pain medication, please contact your pharmacy.  They will contact our office to request authorization.  Prescriptions will not be filled after 5pm or on week-ends. 4. You should eat very light the first 24 hours after surgery, such as soup, crackers, pudding, etc.  Resume your normal diet the day after surgery. 5. Most patients will experience some swelling and bruising in the breast.  Ice packs and a good support bra will help.  Wear the breast binder provided or a sports bra for 72 hours day and night.  After that wear a sports bra during the day until you return to the office. Swelling and bruising can take several days to resolve.  6. It is common to experience some constipation if taking pain medication after surgery.  Increasing fluid intake and taking a stool softener will usually help or prevent this problem from occurring.  A mild laxative (Milk of Magnesia or Miralax) should be taken according to package directions if there are no bowel movements after 48 hours. 7. Unless discharge instructions indicate otherwise, you may remove your bandages  48 hours after surgery and you may shower at that time.  You may have steri-strips (small skin tapes) in place directly over the incision.  These strips should be left on the skin for 7-10 days and will come off on their own.  If your surgeon used skin glue on the incision, you may shower in 24 hours.  The glue will flake off over the next 2-3 weeks.  Any sutures or staples will be removed at the office during your follow-up visit. 8. ACTIVITIES:  You may resume regular daily activities (gradually increasing) beginning the next day.  Wearing a good support bra or sports bra minimizes pain and swelling.  You may have sexual intercourse when it is comfortable. a. You may drive when you no longer are taking prescription pain medication, you can comfortably wear a seatbelt, and you can safely maneuver your car and apply brakes. b. RETURN TO WORK:  ______________________________________________________________________________________ 9. You should see your doctor in the office for a follow-up appointment approximately two weeks after your surgery.  Your doctors nurse will typically make your follow-up appointment when she calls you with your pathology report.  Expect your pathology report 3-4 business days after your surgery.  You may call to check if you do not hear from [place] after three days. 10. OTHER INSTRUCTIONS: _______________________________________________________________________________________________ _____________________________________________________________________________________________________________________________________ _____________________________________________________________________________________________________________________________________ _____________________________________________________________________________________________________________________________________  WHEN TO CALL DR Braxten Memmer: 1. Fever over 101.0 2. Nausea and/or vomiting. 3. Extreme swelling or  bruising. 4. Continued bleeding from incision. 5. Increased pain, redness, or  drainage from the incision.  The clinic staff is available to answer your questions during regular business hours.  Please dont hesitate to call and ask to speak to one of the nurses for clinical concerns.  If you have a medical emergency, go to the nearest emergency room or call 911.  A surgeon from [facility] is always on call at the hospital.  For further questions, please visit [email or web address]

## 2017-06-20 NOTE — Interval H&P Note (Signed)
History and Physical Interval Note:  06/20/2017 9:54 AM  Crystal Robles  has presented today for surgery, with the diagnosis of LEFT BREAST CANCER  The various methods of treatment have been discussed with the patient and family. After consideration of risks, benefits and other options for treatment, the patient has consented to  Procedure(s): LEFT BREAST LUMPECTOMY WITH RADIOACTIVE SEED AND SENTINEL LYMPH NODE BIOPSY (Left) as a surgical intervention .  The patient's history has been reviewed, patient examined, no change in status, stable for surgery.  I have reviewed the patient's chart and labs.  Questions were answered to the patient's satisfaction.     Rolm Bookbinder

## 2017-06-20 NOTE — Anesthesia Procedure Notes (Signed)
Procedure Name: LMA Insertion Date/Time: 06/20/2017 10:39 AM Performed by: Mariea Clonts, CRNA Pre-anesthesia Checklist: Patient identified, Emergency Drugs available, Suction available and Patient being monitored Patient Re-evaluated:Patient Re-evaluated prior to induction Oxygen Delivery Method: Circle System Utilized Preoxygenation: Pre-oxygenation with 100% oxygen Induction Type: IV induction Ventilation: Mask ventilation without difficulty LMA: LMA inserted LMA Size: 4.0 Number of attempts: 1 Airway Equipment and Method: Bite block Placement Confirmation: positive ETCO2 Tube secured with: Tape Dental Injury: Teeth and Oropharynx as per pre-operative assessment

## 2017-06-20 NOTE — Op Note (Signed)
Preoperative diagnosis:left breast cancer, clinical stage I Postoperative diagnosis: same as above Procedure: Left breast seed guided lumpectomy Left deep axillary sentinel node biopsy Surgeon: Dr Serita Grammes YDX:AJOINOM Anes: general  Specimens  1.left breast tissue marked with paint 2.left axillary sentinel nodes with highest [id number] 3. Additional medial, superior and inferior left breast margins marked short stitch superior, long stitch lateral and double stitch deep Complications none Drains none Sponge count correct Dispo to pacu stable  Indications: This is a57 yof who has clinical stage I left breast cancer.   We discussed lumpectomy and sn biopsy.she had seed placed prior to beginning.    Procedure: After informed consent was obtained the patient was taken to the operating room. She first was given technetium in standard periareolar fashion. She had a pectoral block. She was given antibiotics. Sequential compression devices were on her legs. She was then placed under general anesthesia with an LMA. Then she was prepped and draped in the standard sterile surgical fashion. Surgical timeout was then performed.  I then located the seed in theupper outer quadrant of the left breast. I infiltrated marcaine and made a curvilinear incision in the left upper outer quadrant due to the seed being near the skin. I then used the neoprobe to remove the seed and the surrounding tissue with attempt to get clear margins. I marked this with paint. MM confirmed removal of seed and theclip.I removed additional margins as above. The posterior margin is the pectoralis muscle. I placed clips in the cavity.I then obtained hemostasis. This was marked as above.Icarried this through the axillary fascia via the same incision. There was radioactivity present that was easily noted I removed the radioactive nodes. The background radioactivity was minimal.I then obtained  hemostasis. I closed the axillary fascia with 2-0 vicryl.The skin was closed with 3-0 vicryl and 4-0 monocryl. Glue and steristrips were placed. She was extubated in or and transferred to pacu stable.

## 2017-06-23 ENCOUNTER — Encounter (HOSPITAL_COMMUNITY): Payer: Self-pay | Admitting: General Surgery

## 2017-06-23 NOTE — Anesthesia Postprocedure Evaluation (Signed)
Anesthesia Post Note  Patient: Crystal Robles  Procedure(s) Performed: LEFT BREAST LUMPECTOMY WITH RADIOACTIVE SEED AND SENTINEL LYMPH NODE BIOPSY (Left Breast)     Patient location during evaluation: PACU Anesthesia Type: General Level of consciousness: awake and alert Pain management: pain level controlled Vital Signs Assessment: post-procedure vital signs reviewed and stable Respiratory status: spontaneous breathing, nonlabored ventilation and respiratory function stable Cardiovascular status: blood pressure returned to baseline and stable Postop Assessment: no apparent nausea or vomiting Anesthetic complications: no    Last Vitals:  Vitals:   06/20/17 1138 06/20/17 1152  BP: (!) 112/56 112/63  Pulse: 70 71  Resp:  20  Temp:  36.6 C  SpO2: 100% 98%    Last Pain:  Vitals:   06/20/17 1152  TempSrc:   PainSc: 0-No pain                 Lynda Rainwater

## 2017-07-03 ENCOUNTER — Inpatient Hospital Stay: Payer: BLUE CROSS/BLUE SHIELD | Attending: Hematology and Oncology | Admitting: Hematology and Oncology

## 2017-07-03 DIAGNOSIS — Z170 Estrogen receptor positive status [ER+]: Secondary | ICD-10-CM | POA: Insufficient documentation

## 2017-07-03 DIAGNOSIS — C50412 Malignant neoplasm of upper-outer quadrant of left female breast: Secondary | ICD-10-CM | POA: Diagnosis not present

## 2017-07-03 NOTE — Assessment & Plan Note (Signed)
06/20/2017: Left lumpectomy: IDC grade 1, 0.6 cm, DCIS intermediate grade, margins negative, 0/2 lymph nodes negative, ER 100%, PR 95%, HER-2 negative ratio 0.97, Ki-67 2%, T1 be N0 stage Ia  Pathology counseling: I discussed the final pathology report of the patient provided  a copy of this report. I discussed the margins as well as lymph node surgeries. We also discussed the final staging along with previously performed ER/PR and HER-2/neu testing.  Recommendation: 1.  Because the tumor is less than a centimeter and it is grade 1, she does not need Oncotype testing. 2. adjuvant radiation therapy  3.  Followed by adjuvant antiestrogen therapy   Return to clinic at the end of radiation.

## 2017-07-03 NOTE — Progress Notes (Signed)
Patient Care Team: [place], Floris At as PCP - General (Family Medicine)  DIAGNOSIS:  Encounter Diagnosis  Name Primary?  . 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  She denies any further pain or discomfort.  She appears to be healing very well from the recent surgery.  REVIEW OF SYSTEMS:   Constitutional: Denies fevers, chills or abnormal weight loss Eyes: Denies blurriness of vision Ears, nose, mouth, throat, and face: Denies mucositis or sore throat Respiratory: Denies cough, dyspnea or wheezes Cardiovascular: Denies palpitation, chest discomfort Gastrointestinal:  Denies nausea, heartburn or change in bowel habits Skin: Denies abnormal skin rashes Lymphatics: Denies new lymphadenopathy or easy bruising Neurological:Denies numbness, tingling or new weaknesses Behavioral/Psych: Mood is stable, no new changes  Extremities: No lower extremity edema Breast: Recent left  lumpectomy All other systems were reviewed with the patient and are negative.  I have reviewed the past medical history, past surgical history, social history and family history with the patient and they are unchanged from previous note.  ALLERGIES:  is allergic to avelox [moxifloxacin hcl in nacl]; biaxin [clarithromycin]; codeine; and penicillins.  MEDICATIONS:  Current Outpatient Medications  Medication Sig Dispense Refill  . azithromycin (ZITHROMAX) 250 MG tablet Take 500 mg by mouth See admin instructions. Take 2 tablets (500 mg) by mouth 1 hour prior to dental procedures    . calcium carbonate (CALCIUM 600) 600 MG TABS tablet Take 600 mg by mouth 2 (two) times daily.     . Calcium-Magnesium-Vitamin D (CALCIUM MAGNESIUM PO) Take 5 mLs by mouth every evening. (1 TEASPOONFUL) CALM CALCIUM (a proprietary blend of citric acid and magnesium carbonate)    . Cholecalciferol (VITAMIN D-1000 MAX ST) 1000 units tablet Take 3,000 Units by mouth daily at 2 PM.     . Coenzyme Q10 (COQ10) 100 MG CAPS Take 100 mg by mouth daily at 2 PM.    . estradiol (VIVELLE-DOT) 0.075 MG/24HR Place 1 patch onto the skin 2 (two) times a week.     . fluticasone (FLONASE) 50 MCG/ACT nasal spray Place 2 sprays into both nostrils daily as needed (for allergies.).     Marland Kitchen ibuprofen (ADVIL,MOTRIN) 200 MG tablet Take 400 mg by mouth every 8 (eight) hours as needed (for pain.).    Marland Kitchen Omega-3 Fatty Acids (FISH OIL) 1000 MG CAPS Take 1,000 mg by mouth daily.    . progesterone (PROMETRIUM) 100 MG capsule Take 100 mg by mouth at bedtime.     Marland Kitchen  traMADol (ULTRAM) 50 MG tablet Take 2 tablets (100 mg total) by mouth every 6 (six) hours as needed for moderate pain. 10 tablet 0  . vitamin C (ASCORBIC ACID) 500 MG tablet Take 1,500 mg by mouth daily.    Marland Kitchen zinc gluconate 50 MG tablet Take 50 mg by mouth daily at 2 PM.      No current facility-administered medications for this visit.     PHYSICAL EXAMINATION: ECOG PERFORMANCE STATUS: 1  - Symptomatic but completely ambulatory  Vitals:   07/03/17 1014  BP: 113/67  Pulse: 62  Resp: 19  Temp: 97.8 F (36.6 C)  SpO2: 100%   Filed Weights   07/03/17 1014  Weight: 147 lb 6.4 oz (66.9 kg)    GENERAL:alert, no distress and comfortable SKIN: skin color, texture, turgor are normal, no rashes or significant lesions EYES: normal, Conjunctiva are pink and non-injected, sclera clear OROPHARYNX:no exudate, no erythema and lips, buccal mucosa, and tongue normal  NECK: supple, thyroid normal size, non-tender, without nodularity LYMPH:  no palpable lymphadenopathy in the cervical, axillary or inguinal LUNGS: clear to auscultation and percussion with normal breathing effort HEART: regular rate & rhythm and no murmurs and no lower extremity edema ABDOMEN:abdomen soft, non-tender and normal bowel sounds MUSCULOSKELETAL:no cyanosis of digits and no clubbing  NEURO: alert & oriented x 3 with fluent speech, no focal motor/sensory deficits EXTREMITIES: No lower extremity edema  LABORATORY DATA:  I have reviewed the data as listed CMP Latest Ref Rng & Units 06/18/2017  Glucose 65 - 99 mg/dL 103(H)  BUN 6 - 20 mg/dL 14  Creatinine 0.44 - 1.00 mg/dL 0.89  Sodium 135 - 145 mmol/L 138  Potassium 3.5 - 5.1 mmol/L 4.1  Chloride 101 - 111 mmol/L 107  CO2 22 - 32 mmol/L 24  Calcium 8.9 - 10.3 mg/dL 9.4    Lab Results  Component Value Date   WBC 6.2 06/18/2017   HGB 13.5 06/18/2017   HCT 42.4 06/18/2017   MCV 88.1 06/18/2017   PLT 296 06/18/2017    ASSESSMENT & PLAN:  Malignant neoplasm of upper-outer quadrant of left breast in female, estrogen receptor positive ([place]) 06/20/2017: Left lumpectomy: IDC grade 1, 0.6 cm, DCIS intermediate grade, margins negative, 0/2 lymph nodes negative, ER 100%, PR 95%, HER-2 negative ratio 0.97, Ki-67 2%, T1 be N0 stage Ia  Pathology counseling: I discussed the final pathology report of the patient provided  a copy of this report. I discussed the  margins as well as lymph node surgeries. We also discussed the final staging along with previously performed ER/PR and HER-2/neu testing.  Recommendation: 1.  Because the tumor is less than a centimeter and it is grade 1, she does not need Oncotype testing. 2. adjuvant radiation therapy (patient wishes to see Dr. Isidore Moos) 3.  Followed by adjuvant antiestrogen therapy (I recommended tamoxifen but she is not interested in tamoxifen therapy.  She in fact does not want to take any antiestrogen therapy because she does not believe that changing the hormonal milieu is going to be good for her.  We had extensive and lengthy conversations about her risk of recurrence in the relative and absolute benefits of both radiation and antiestrogen therapy.  I believe that she may decide to just do radiation and no antiestrogen therapy.  Return to clinic at the end of radiation.    No orders of the defined types were placed in this encounter.  The patient has a good understanding of the overall plan.  she agrees with it. she will call with any problems that may develop before the next visit here.   Harriette Ohara, MD 07/03/17

## 2017-07-08 ENCOUNTER — Encounter: Payer: Self-pay | Admitting: Radiation Oncology

## 2017-07-10 ENCOUNTER — Ambulatory Visit: Payer: BLUE CROSS/BLUE SHIELD

## 2017-07-10 ENCOUNTER — Ambulatory Visit
Admission: RE | Admit: 2017-07-10 | Discharge: 2017-07-10 | Disposition: A | Discharge status: Home / Self Care | Payer: BLUE CROSS/BLUE SHIELD | Source: Ambulatory Visit | Attending: Radiation Oncology | Admitting: Radiation Oncology

## 2017-07-16 NOTE — Progress Notes (Addendum)
Location of Breast Cancer: Left Breast  Histology per Pathology Report:  04/04/17 Diagnosis Breast, left, needle core biopsy, 1:00 8 cmfn - INVASIVE DUCTAL CARCINOMA, SEE COMMENT. - DUCTAL CARCINOMA IN SITU.  Receptor Status: ER(100%), PR (95%), Her2-neu (NEG), Ki-(2%)  06/20/17 Diagnosis 1. Breast, lumpectomy, left - INVASIVE DUCTAL CARCINOMA, GRADE I/III, SPANNING 0.6 CM. - DUCTAL CARCINOMA IN SITU, INTERMEDIATE GRADE. - DUCTAL CARCINOMA IN SITU IS FOCALLY LESS THAN 0.1 CM TO THE POSTERIOR MARGIN OF SPECIMEN 1. - SEE ONCOLOGY TABLE BELOW. 2. Lymph node, sentinel, biopsy, left - THERE IS NO EVIDENCE OF CARCINOMA IN 1 OF 1 LYMPH NODE (0/1). 3. Lymph node, sentinel, biopsy - THERE IS NO EVIDENCE OF CARCINOMA IN 1 OF 1 LYMPH NODE (0/1). 4. Breast, excision, left medial margin - BENIGN FIBROADIPOSE TISSUE. - THERE IS NO EVIDENCE OF MALIGNANCY. - SEE COMMENT. 5. Breast, excision, left inferior margin - BENIGN BREAST PARENCHYMA. - THERE IS NO EVIDENCE OF MALIGNANCY. - SEE COMMENT. 6. Breast, excision, left superior margin - BENIGN BREAST PARENCHYMA. - THERE IS NO EVIDENCE OF MALIGNANCY. - SEE COMMENT   Did patient present with symptoms or was this found on screening mammography?: It was discovered on a screening mammogram.   Past/Anticipated interventions by surgeon, if any: 06/20/17 Procedure: Left breast seed guided lumpectomy Leftdeep axillary sentinel node biopsy Surgeon: Dr Serita Grammes   Past/Anticipated interventions by medical oncology, if any: Dr. Lindi Adie 07/03/17 Recommendation: 1.  Because the tumor is less than a centimeter and it is grade 1, she does not need Oncotype testing. 2. adjuvant radiation therapy (patient wishes to see Dr. Isidore Moos) 3.  Followed by adjuvant antiestrogen therapy (I recommended tamoxifen but she is not interested in tamoxifen therapy.  She in fact does not want to take any antiestrogen therapy because she does not believe that changing  the hormonal milieu is going to be good for her. -We had extensive and lengthy conversations about her risk of recurrence in the relative and absolute benefits of both radiation and antiestrogen therapy.  I believe that she may decide to just do radiation and no antiestrogen therapy. -Return to clinic at the end of radiation.   Lymphedema issues, if any:  She denies. She has seen PT, but was discharged. She has numbness to her upper arm, but it is improving.   Pain issues, if any:  She denies.   SAFETY ISSUES:  Prior radiation? No  Pacemaker/ICD? No  Possible current pregnancy? No  Is the patient on methotrexate? No  Current Complaints / other details:    BP 94/63 (BP Location: Right Arm, Patient Position: Sitting, Cuff Size: Normal)   Pulse 63   Temp 97.9 F (36.6 C) (Oral)   Resp 18   Ht 5' 4"  (1.626 m)   Wt 149 lb 6.4 oz (67.8 kg)   SpO2 99%   BMI 25.64 kg/m    Wt Readings from Last 3 Encounters:  07/28/17 149 lb 6.4 oz (67.8 kg)  07/03/17 147 lb 6.4 oz (66.9 kg)  06/20/17 145 lb 11.2 oz (66.1 kg)      Crystal Robles, Stephani Police, RN 07/18/2017,8:56 AM

## 2017-07-23 ENCOUNTER — Other Ambulatory Visit: Payer: Self-pay

## 2017-07-23 ENCOUNTER — Ambulatory Visit: Payer: BLUE CROSS/BLUE SHIELD | Attending: General Surgery | Admitting: Physical Therapy

## 2017-07-23 ENCOUNTER — Encounter: Payer: Self-pay | Admitting: Physical Therapy

## 2017-07-23 DIAGNOSIS — R293 Abnormal posture: Secondary | ICD-10-CM | POA: Insufficient documentation

## 2017-07-23 DIAGNOSIS — C50412 Malignant neoplasm of upper-outer quadrant of left female breast: Secondary | ICD-10-CM | POA: Diagnosis present

## 2017-07-23 NOTE — Therapy (Signed)
[place], [place], [address] Phone: [phone]   Fax:  [phone]  Physical Therapy Evaluation  Patient Details  Name: Crystal Robles MRN: [id number] Date of Birth: 1950/11/26 Referring Provider: Donne Hazel   Encounter Date: 07/23/2017  PT End of Session - 07/23/17 1420    Visit Number  1    Number of Visits  1    PT Start Time  1350    PT Stop Time  1415    PT Time Calculation (min)  25 min    Activity Tolerance  Patient tolerated treatment well    Behavior During Therapy  [facility] for tasks assessed/performed       Past Medical History:  Diagnosis Date  . Cancer ([place])    BREAST CA   . PAT (paroxysmal atrial tachycardia) ([place])   . PONV (postoperative nausea and vomiting)     Past Surgical History:  Procedure Laterality Date  . BREAST LUMPECTOMY WITH RADIOACTIVE SEED AND SENTINEL LYMPH NODE BIOPSY Left 06/20/2017   Procedure: LEFT BREAST LUMPECTOMY WITH RADIOACTIVE SEED AND SENTINEL LYMPH NODE BIOPSY;  Surgeon: Rolm Bookbinder, MD;  Location: [place];  Service: General;  Laterality: Left;  . CESAREAN SECTION  1993  . WRIST SURGERY Left 07/2003    There were no vitals filed for this visit.   Subjective Assessment - 07/23/17 1351    Subjective  I had a left lumpectomy on 06/20/17 with a SLNB (2) both were negative. I do not need chemotherapy. We are still discussing radiation.     Pertinent History  left lumpectomy and SLNB 06/20/17, might require radiation    Patient Stated Goals  pt feels like she is not having any difficulty    Currently in Pain?  No/denies         [facility] PT Assessment - 07/23/17 0001      Assessment   Medical Diagnosis  left breast cancer    Referring Provider  Donne Hazel    Onset Date/Surgical Date  06/20/17    Hand Dominance  Right    Prior Therapy  none      Precautions   Precautions  Other (comment)    Precaution Comments  at risk for lymphedema       Restrictions   Weight Bearing Restrictions  No      Balance Screen   Has the patient fallen in the past 6 months  No    Has the patient had a decrease in activity level because of a fear of falling?   No    Is the patient reluctant to leave their home because of a fear of falling?   No      Home Film/video editor residence    Living Arrangements  Spouse/significant other    Available Help at Discharge  Family    Type of [place]  None      Prior Function   Level of [place]  Retired    Leisure  exercises every day, treadmill walk/run for 3 miles, long walks, weight class 2-3x/wk      Cognition   Overall Cognitive Status  Within Functional Limits for tasks assessed      ROM / Strength   AROM / PROM / Strength  AROM      AROM   Overall AROM   Within functional limits for tasks performed  LYMPHEDEMA/ONCOLOGY QUESTIONNAIRE - 07/23/17 1359      Type   Cancer Type  left breast cancer      Surgeries   Lumpectomy Date  06/20/17    Sentinel Lymph Node Biopsy Date  06/20/17    Number Lymph Nodes Removed  2      Treatment   Active Chemotherapy Treatment  No    Past Chemotherapy Treatment  No    Active Radiation Treatment  No    Past Radiation Treatment  No    Current Hormone Treatment  No    Past Hormone Therapy  No      What other symptoms do you have   Are you Having Heaviness or Tightness  No    Are you having Pain  No    Are you having pitting edema  No    Is it Hard or Difficult finding clothes that fit  No    Do you have infections  No    Is there Decreased scar mobility  No      Lymphedema Assessments   Lymphedema Assessments  Upper extremities      Right Upper Extremity Lymphedema   15 cm Proximal to Olecranon Process  28.4 cm    Olecranon Process  23.5 cm    15 cm Proximal to Ulnar Styloid Process  22.5 cm    Just Proximal to Ulnar Styloid Process  14.1 cm    Across Hand at  [employer]  18 cm    At [place] of 2nd Digit  5.8 cm      Left Upper Extremity Lymphedema   15 cm Proximal to Olecranon Process  30 cm    Olecranon Process  24 cm    15 cm Proximal to Ulnar Styloid Process  22.3 cm    Just Proximal to Ulnar Styloid Process  14.1 cm    Across Hand at [employer]  18 cm    At [place] of 2nd Digit  5.8 cm          Quick Dash - 07/23/17 0001    Open a tight or new jar  No difficulty    Do heavy household chores (wash walls, wash floors)  No difficulty    Carry a shopping bag or briefcase  No difficulty    Wash your back  No difficulty    Use a knife to cut food  No difficulty    Recreational activities in which you take some force or impact through your arm, shoulder, or hand (golf, hammering, tennis)  No difficulty    During the past week, to what extent has your arm, shoulder or hand problem interfered with your normal social activities with family, friends, neighbors, or groups?  Not at all    During the past week, to what extent has your arm, shoulder or hand problem limited your work or other regular daily activities  Not at all    Arm, shoulder, or hand pain.  None    Tingling (pins and needles) in your arm, shoulder, or hand  None    Difficulty Sleeping  No difficulty    DASH Score  0 %        Objective measurements completed on examination: See above findings.              PT Education - 07/23/17 1420    Education Details  lymphedema risk reduction practices, ABC class    Person(s) Educated  Patient    Methods  Explanation;Handout    Comprehension  Verbalized understanding                  Plan - 07/23/17 1420    Clinical Impression Statement  Pt presents to PT following a left lumpectomy and SLNB on 06/20/17 for treatment of L breast cancer. Currently she presents with full ROM bilaterally and has been exercising consistently. She is not having any trouble with her left shoulder and has returned to her prior  level of function. Pt was educated about lymphedema risk reduction practices as well as ABC class. At this time she is not interested in attending the ABC class. She was educated that if she chooses to do radiation she may require PT for increased tightness or lymphedema. Pt was educated about signs of lymphedema and to get another referral if she she notices any heaviness or swelling in her left UE or if she has decreased ROM from radiation. This will be a one time visit only at this time.     History and Personal Factors relevant to plan of care:  pt is right handed    Clinical Presentation  Stable    Clinical Decision Making  Low    Rehab Potential  Excellent    PT Frequency  One time visit    PT Treatment/Interventions  ADLs/Self Care Home Management    PT Next Visit Plan  dc this visit    PT Home Exercise Plan  pt does post op breast exercises    Consulted and Agree with Plan of Care  Patient       Patient will benefit from skilled therapeutic intervention in order to improve the following deficits and impairments:  Decreased knowledge of precautions  Visit Diagnosis: Abnormal posture  Malignant neoplasm of upper-outer quadrant of left female breast, unspecified estrogen receptor status ([place])     Problem List Patient Active Problem List   Diagnosis Date Noted  . Malignant neoplasm of upper-outer quadrant of left breast in female, estrogen receptor positive ([place]) 06/05/2017  . Tendinopathy of rotator cuff 03/02/2014  . Pain in joint, shoulder region 06/02/2012    Allyson Sabal [facility] 07/23/2017, 2:26 PM  [place], [place], [address] Phone: [phone]   Fax:  [phone]  Name: Crystal Robles MRN: [id number] Date of Birth: [birth year]  Manus Gunning, PT 07/23/17 2:26 PM

## 2017-07-28 ENCOUNTER — Ambulatory Visit
Admission: RE | Admit: 2017-07-28 | Discharge: 2017-07-28 | Disposition: A | Discharge status: Home / Self Care | Payer: BLUE CROSS/BLUE SHIELD | Source: Ambulatory Visit | Attending: Radiation Oncology | Admitting: Radiation Oncology

## 2017-07-28 ENCOUNTER — Other Ambulatory Visit: Payer: Self-pay

## 2017-07-28 ENCOUNTER — Encounter: Payer: Self-pay | Admitting: Radiation Oncology

## 2017-07-28 VITALS — BP 94/63 | HR 63 | Temp 97.9°F | Resp 18 | Ht 64.0 in | Wt 149.4 lb

## 2017-07-28 DIAGNOSIS — I471 Supraventricular tachycardia: Secondary | ICD-10-CM | POA: Insufficient documentation

## 2017-07-28 DIAGNOSIS — Z170 Estrogen receptor positive status [ER+]: Principal | ICD-10-CM

## 2017-07-28 DIAGNOSIS — Z79899 Other long term (current) drug therapy: Secondary | ICD-10-CM | POA: Diagnosis not present

## 2017-07-28 DIAGNOSIS — Z800 Family history of malignant neoplasm of digestive organs: Secondary | ICD-10-CM | POA: Diagnosis not present

## 2017-07-28 DIAGNOSIS — C50412 Malignant neoplasm of upper-outer quadrant of left female breast: Secondary | ICD-10-CM

## 2017-07-29 ENCOUNTER — Encounter: Payer: Self-pay | Admitting: Radiation Oncology

## 2017-07-29 NOTE — Progress Notes (Signed)
Radiation Oncology         [phone] ________________________________  Outpatient Follow-up  Name: Crystal Robles MRN: [id number]  Date: 07/28/2017  DOB: July 16, 1950  [id number], [place] [facility]  [place], MD   REFERRING PHYSICIAN: Nicholas Lose, MD  DIAGNOSIS:    ICD-10-CM   1. 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 female who presented with breast abnormality on the following imaging: Bilateral breast mammogram with TOMO and CAD on the date of 03/26/2017. At that time it was recommended she seek further evaluation for possible asymmetries in the left breast.  On 04/04/2017 she underwent a left breast needle core biopsy at 1:00, 8cmfn, showing invasive ductal carcinoma. Ductal carcinoma IN SITU. Estrogen Receptor: 100%, positive, strong staining intensity; Progesterone Receptor: 95%, positive, strong staining intensity; Ki67: 2%; HER-2 negative, ratio of 0.97.   She underwent another left breast biopsy on 06/05/2017 showing fibrocystic changes. No evidence of malignancy.   The patient had a left breast lumpectomy on 06/20/2017 showing invasive ductal carcinoma, grade I/III, spanning 0.6 cm. ductal carcinoma IN SITU intermediate grade. Ductal carcinoma IN SITU is focally less than 0.1 cm to the posterior margin of specimen 1. Estrogen  Receptor: 100% Strong; Progesterone Receptor: 95% Strong; HER2: No amplification was identified; Ki-67: 2%. pT1b, pN0.  The patient was previously seen at [facility] and is now being followed by Dr. Lindi Adie.  She has elected to decline antiestrogen therapy although this was recommended therapy by Dr. Lindi Adie.  She is electing to taper her hormonal replacement therapy through Dr. Helane Rima as she does not want to sustain the side effects of sudden withdrawal from hormone replacement therapy  Patient was previously seen by Shona Simpson, physician's assistant for [facility].  She requested an opinion from a different radiation oncologist postoperatively.   PREVIOUS RADIATION THERAPY: No  PAST MEDICAL HISTORY:  has a past medical history of Cancer ([place]), PAT (paroxysmal atrial tachycardia) ([place]), and PONV (postoperative nausea and vomiting).    PAST SURGICAL HISTORY: Past Surgical History:  Procedure Laterality Date  . BREAST LUMPECTOMY WITH RADIOACTIVE SEED AND SENTINEL LYMPH NODE BIOPSY Left 06/20/2017   Procedure: LEFT BREAST LUMPECTOMY WITH RADIOACTIVE SEED AND SENTINEL LYMPH NODE BIOPSY;  Surgeon: Rolm Bookbinder, MD;  Location: [facility];  Service: General;  Laterality: Left;  . CESAREAN SECTION  1993  . WRIST SURGERY Left 07/2003    FAMILY HISTORY: family history includes Diabetes in her brother and father; Pancreatic cancer in her mother.  SOCIAL HISTORY:  reports that she has never smoked. She has never used smokeless tobacco. She reports that she drank alcohol. She reports that she does not use drugs.  ALLERGIES: Avelox [moxifloxacin hcl in nacl]; Biaxin [clarithromycin]; Codeine; and Penicillins  MEDICATIONS:  Current Outpatient Medications  Medication Sig Dispense Refill  . azithromycin (ZITHROMAX) 250 MG tablet Take 500 mg by mouth  See admin instructions. Take 2 tablets (500 mg) by mouth 1 hour prior to dental procedures    . calcium carbonate (CALCIUM 600) 600 MG TABS tablet  Take 600 mg by mouth 2 (two) times daily.     . Calcium-Magnesium-Vitamin D (CALCIUM MAGNESIUM PO) Take 5 mLs by mouth every evening. (1 TEASPOONFUL) CALM CALCIUM (a proprietary blend of citric acid and magnesium carbonate)    . Cholecalciferol (VITAMIN D-1000 MAX ST) 1000 units tablet Take 3,000 Units by mouth daily at 2 PM.     . Coenzyme Q10 (COQ10) 100 MG CAPS Take 100 mg by mouth daily at 2 PM.    . estradiol (VIVELLE-DOT) 0.075 MG/24HR Place 1 patch onto the skin 2 (two) times a week.     . fluticasone (FLONASE) 50 MCG/ACT nasal spray Place 2 sprays into both nostrils daily as needed (for allergies.).     Marland Kitchen ibuprofen (ADVIL,MOTRIN) 200 MG tablet Take 400 mg by mouth every 8 (eight) hours as needed (for pain.).    Marland Kitchen Omega-3 Fatty Acids (FISH OIL) 1000 MG CAPS Take 1,000 mg by mouth daily.    . progesterone (PROMETRIUM) 100 MG capsule Take 100 mg by mouth at bedtime.     . vitamin C (ASCORBIC ACID) 500 MG tablet Take 1,500 mg by mouth daily.    Marland Kitchen zinc gluconate 50 MG tablet Take 50 mg by mouth daily at 2 PM.     . traMADol (ULTRAM) 50 MG tablet Take 2 tablets (100 mg total) by mouth every 6 (six) hours as needed for moderate pain. (Patient not taking: Reported on 07/28/2017) 10 tablet 0   No current facility-administered medications for this encounter.     REVIEW OF SYSTEMS: As above  PHYSICAL EXAM:  height is _0  (1.626 m) and weight is 149 lb 6.4 oz (67.8 kg). Her oral temperature is 97.9 F (36.6 C). Her blood pressure is 94/63 and her pulse is 63. Her respiration is 18 and oxygen saturation is 99%.   General: Alert and oriented, in no acute distress HEENT: Head is normocephalic. Heart: Regular in rate and rhythm with no murmurs, rubs, or gallops. Chest: Clear to auscultation bilaterally, with no rhonchi, wheezes, or rales. Extremities: No cyanosis or edema. Skin: No concerning lesions. Neurologic: Cranial nerves II through XII are grossly intact. No obvious focalities. Speech is  fluent. Coordination is intact. Psychiatric: Judgment and insight are intact. Affect is appropriate. Breasts:Left breast is healing well from lumpectomy and sentinel lymph node biopsy   ECOG = 0  0 - Asymptomatic (Fully active, able to carry on all predisease activities without restriction)  1 - Symptomatic but completely ambulatory (Restricted in physically strenuous activity but ambulatory and able to carry out work of a light or sedentary nature. For example, light housework, office work)  2 - Symptomatic, <50% in bed during the day (Ambulatory and capable of all self care but unable to carry out any work activities. Up and about more than 50% of waking hours)  3 - Symptomatic, >50% in bed, but not bedbound (Capable of only limited self-care, confined to bed or chair 50% or more of waking hours)  4 - Bedbound (Completely disabled. Cannot carry on any self-care. Totally confined to bed or chair)  5 - Death   Eustace Pen MM, Creech RH, Tormey DC, et al. [phone]). "Toxicity and response criteria of the [facility] Group". [place] Oncol. 5 (6): 649-55   LABORATORY DATA:  Lab Results  Component Value  Date   WBC 6.2 06/18/2017   HGB 13.5 06/18/2017   HCT 42.4 06/18/2017   MCV 88.1 06/18/2017   PLT 296 06/18/2017   CMP     Component Value Date/Time   NA 138 06/18/2017 1024   K 4.1 06/18/2017 1024   CL 107 06/18/2017 1024   CO2 24 06/18/2017 1024   GLUCOSE 103 (H) 06/18/2017 1024   BUN 14 06/18/2017 1024   CREATININE 0.89 06/18/2017 1024   CALCIUM 9.4 06/18/2017 1024   GFRNONAA >60 06/18/2017 1024   GFRAA >60 06/18/2017 1024         RADIOGRAPHY: No results found.    IMPRESSION/PLAN: It was a pleasure meeting Ms. Fleer today.  For her left breast cancer, she is electing to decline antiestrogen therapy despite medical oncology's recommendations.  She is concerned about how this therapy could affect her quality of life.  She is also concerned about side effects  from radiotherapy and had many good questions today.  I answered all of her questions to the best of my ability and she appeared satisfied with the answers.  We discussed the risks, benefits, and side effects of radiotherapy. I recommend radiotherapy to the left breast to reduce her risk of locoregional recurrence by 2/3.  We discussed that radiation would take approximately 4 weeks to complete and that I recommend CT simulation in the near future as she is already several weeks out from surgery.  I discussed the data that suggested better outcomes for women who start their treatment within 8 weeks of surgery.  She is still unsure about whether she wants to pursue radiotherapy.  She wants to think about this.  She states that she would like to spend some time at the [facility] this summer and is not interested in starting radiotherapy in July or August.  She would like to defer radiotherapy until the end of the summer if she decides to pursue it   We spoke about acute effects including breast soreness, skin irritation and fatigue as well as much less common late effects including internal organ injury or irritation. We spoke about the latest technology that is used to minimize the risk of late effects for patients undergoing radiotherapy to the breast or chest wall.  I specifically talked about how to limit dose to the heart and lungs and discussed the breath-hold technique.  She states that she would like her fields to be kept smaller than standard to minimize lung exposure.  I told her that could be arranged.  No guarantees of treatment were given.  I will await her call back to our office if she chooses to pursue treatment; nothing will be scheduled based on our consult today.  I wished her the best and enjoyed meeting her this morning.  I spent over 40 minutes face to face with the patient and more than 50% of that time was spent in counseling and/or coordination of care.     __________________________________________   Eppie Gibson, MD  This document serves as a record of services personally performed by Eppie Gibson, MD. It was created on her behalf by Margit Banda, a trained medical scribe. The creation of this record is based on the scribe's personal observations and the provider's statements to them. This document has been checked and approved by the attending provider.

## 2017-08-27 ENCOUNTER — Telehealth: Payer: Self-pay | Admitting: Hematology and Oncology

## 2017-08-27 NOTE — Telephone Encounter (Signed)
Mailed patient calendar of upcoming dec appt per 8/9 sch message

## 2017-09-01 ENCOUNTER — Telehealth: Payer: Self-pay | Admitting: *Deleted

## 2017-09-01 NOTE — Telephone Encounter (Signed)
Spoke with patient 8/9 and she has decided not to move forward with radiation therapy or anti-estrogen.  I have scheduled her to follow up with Dr. Lindi Adie in December.

## 2017-11-20 ENCOUNTER — Encounter: Payer: Self-pay | Admitting: Sports Medicine

## 2017-11-20 ENCOUNTER — Ambulatory Visit (INDEPENDENT_AMBULATORY_CARE_PROVIDER_SITE_OTHER): Payer: BLUE CROSS/BLUE SHIELD | Admitting: Sports Medicine

## 2017-11-20 VITALS — BP 109/47 | Ht 64.0 in | Wt 148.0 lb

## 2017-11-20 DIAGNOSIS — S83001A Unspecified subluxation of right patella, initial encounter: Secondary | ICD-10-CM

## 2017-11-20 NOTE — Progress Notes (Signed)
   HPI  CC: R knee pain  States that she was sitting on the floor about 2 weeks ago and when she went to get up she had sudden onset of right knee pain/weakness.  She states that her right knee felt "unstable" for a time.  No popping sensation or sound.  No swelling She subsequently went for a walk in the next few days and had bilateral lower knee pain at the time. She has had some improvement and was able to do a 3 mile walk yesterday without any pain or weakness. States she has no pain, weakness, swelling  Medications/Interventions Tried: None  See HPI and/or previous note for associated ROS.  Objective: BP (!) 109/47   Ht 5\' 4"  (1.626 m)   Wt 148 lb (67.1 kg)   BMI 25.40 kg/m  Gen: NAD, well groomed, a/o x3, normal affect.  CV: Well-perfused. Warm.  Resp: Non-labored.  Neuro: Sensation intact throughout. No gross coordination deficits.  Gait: Nonpathologic posture, unremarkable stride without signs of limp or balance issues. Right knee: full passive range of motion.  No ligamentous laxity.  Negative McMurray.  Negative Lockman.  Full 5 out of 5 strength. R Hip: 4 out of 5 strength with abduction. 5/5 strength with flexion, adduction, extension   Assessment and plan:  Patellar subluxation, right, initial encounter Patient had an episode of right patellar subluxation 2 weeks ago.  This is likely due to her right hip abductor weakness.  She was given exercises today and follow up in 6 weeks.  Bufford Lope, DO PGY-3, [facility] 11/20/2017 12:01 PM   This was either a minor subluxation or at least some significant PF impingement. I observed and examined the patient with the resident and agree with assessment and plan.  Note reviewed and modified by me. Stefanie Libel, MD

## 2017-11-20 NOTE — Assessment & Plan Note (Signed)
Patient had an episode of right patellar subluxation 2 weeks ago.  This is likely due to her right hip abductor weakness.  She was given exercises today and follow up in 6 weeks.

## 2017-12-18 ENCOUNTER — Ambulatory Visit: Payer: BLUE CROSS/BLUE SHIELD | Admitting: Hematology and Oncology

## 2017-12-18 NOTE — Assessment & Plan Note (Deleted)
06/20/2017: Left lumpectomy: IDC grade 1, 0.6 cm, DCIS intermediate grade, margins negative, 0/2 lymph nodes negative, ER 100%, PR 95%, HER-2 negative ratio 0.97, Ki-67 2%, T1 be N0 stage Ia Because the tumor is less than a centimeter and it is grade 1, she does not need Oncotype testing  Treatment plan: Patient was offered both radiation and antiestrogen therapy. She refused both of these.  Breast cancer surveillance: 1.  Breast exam 12/18/2017: Benign 2.  Mammograms will be arranged for 03/29/2018  Return to clinic in 1 year for follow-up

## 2018-11-13 ENCOUNTER — Other Ambulatory Visit: Payer: Self-pay | Admitting: Obstetrics and Gynecology

## 2018-11-13 DIAGNOSIS — Z9889 Other specified postprocedural states: Secondary | ICD-10-CM

## 2018-11-25 ENCOUNTER — Ambulatory Visit
Admission: RE | Admit: 2018-11-25 | Discharge: 2018-11-25 | Disposition: A | Discharge status: Home / Self Care | Payer: BLUE CROSS/BLUE SHIELD | Source: Ambulatory Visit | Attending: Obstetrics and Gynecology | Admitting: Obstetrics and Gynecology

## 2018-11-25 ENCOUNTER — Other Ambulatory Visit: Payer: Self-pay

## 2018-11-25 DIAGNOSIS — Z9889 Other specified postprocedural states: Secondary | ICD-10-CM

## 2018-11-25 HISTORY — DX: Malignant neoplasm of unspecified site of unspecified female breast: C50.919

## 2019-02-07 IMAGING — MG DIGITAL SCREENING BILATERAL MAMMOGRAM WITH TOMO AND CAD
8 series · 9 of 24 positions shown · non-contrast
Comparison: Previous exam(s).

CLINICAL DATA: Screening.

EXAM:
DIGITAL SCREENING BILATERAL MAMMOGRAM WITH TOMO AND CAD

[R MLO synth-2D]
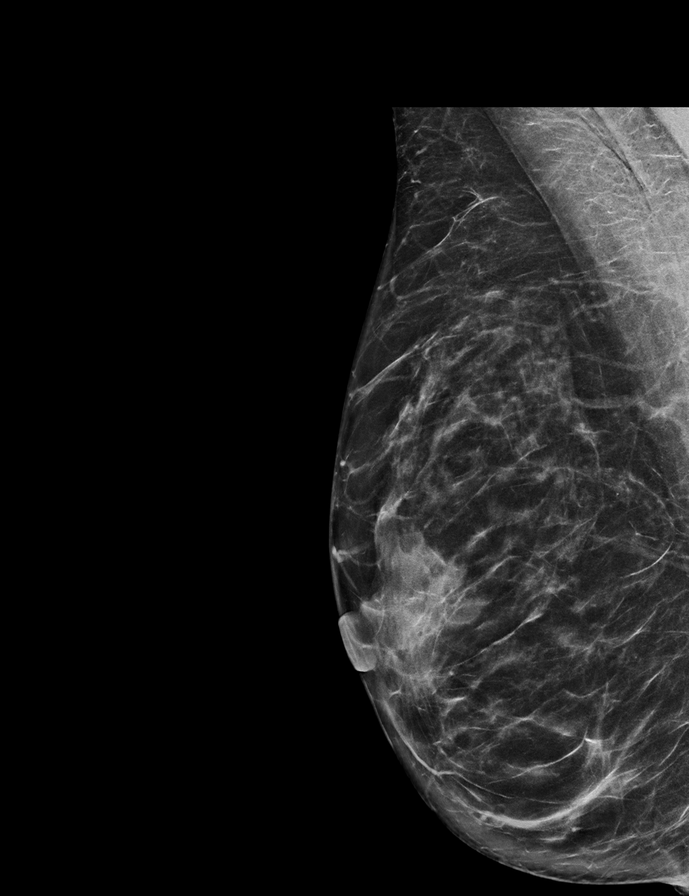

[L MLO synth-2D]
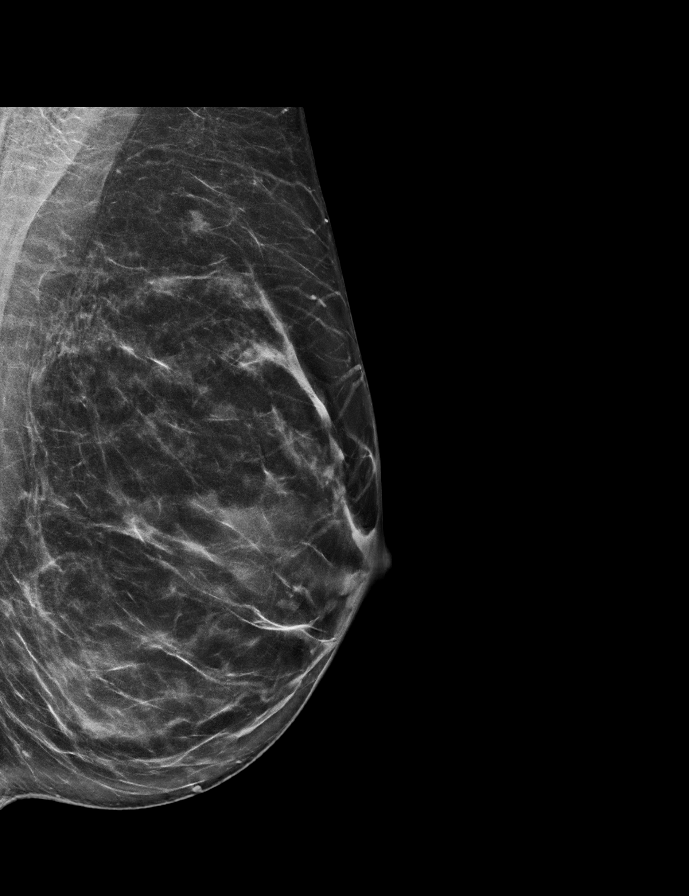

[L CC synth-2D]
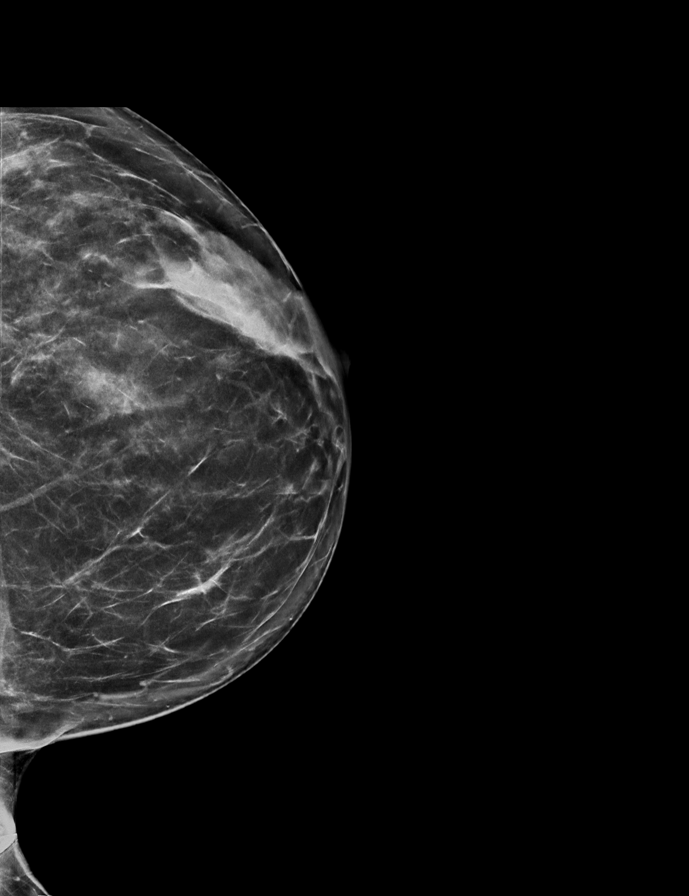

[R CC synth-2D]
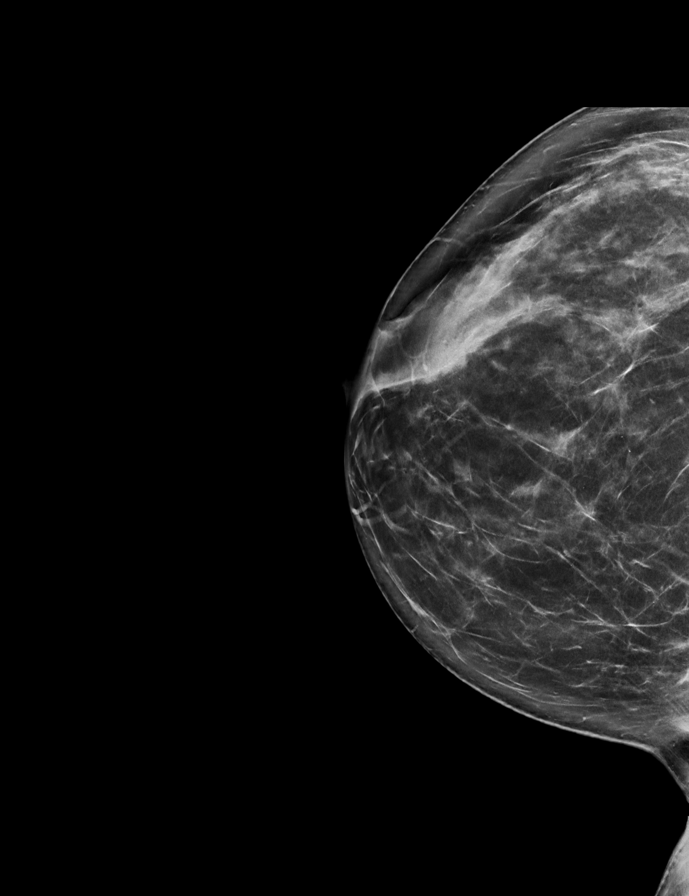

[R MLO tomo · 2 of 71 frames shown]
[frame 23/71]
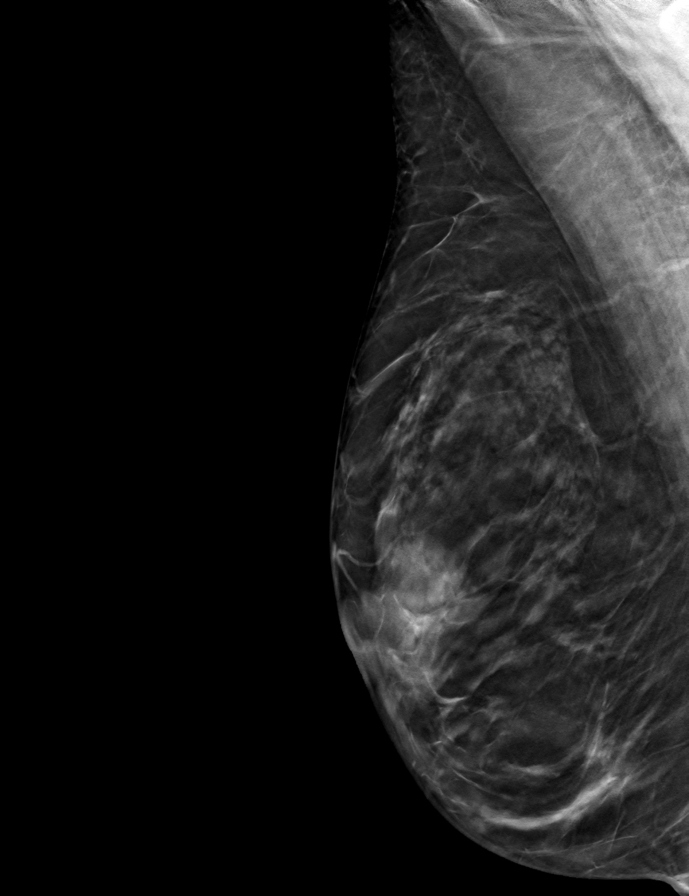
[frame 36/71]
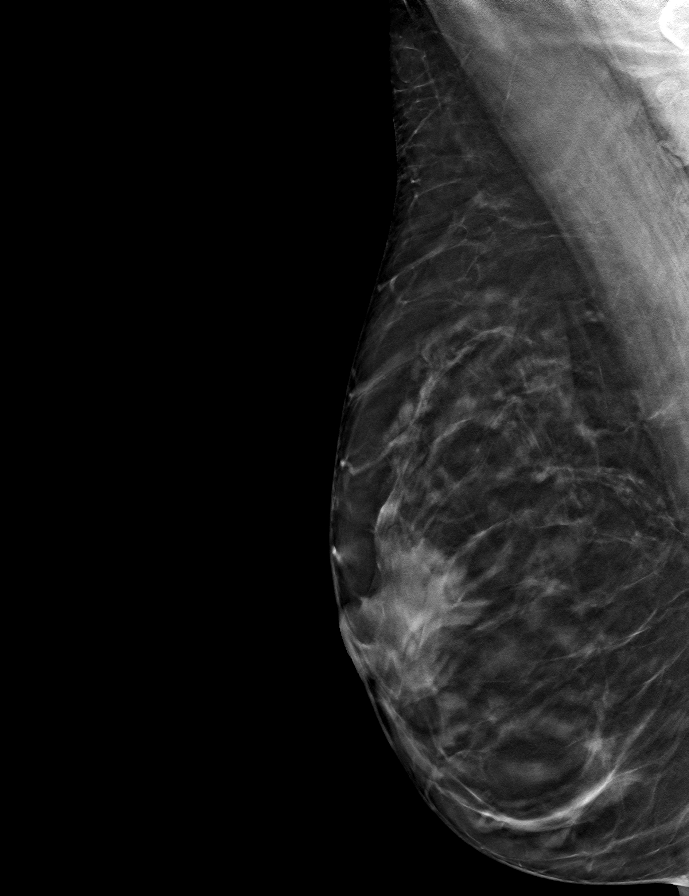

[L MLO tomo · tomo slice 36/71.0]
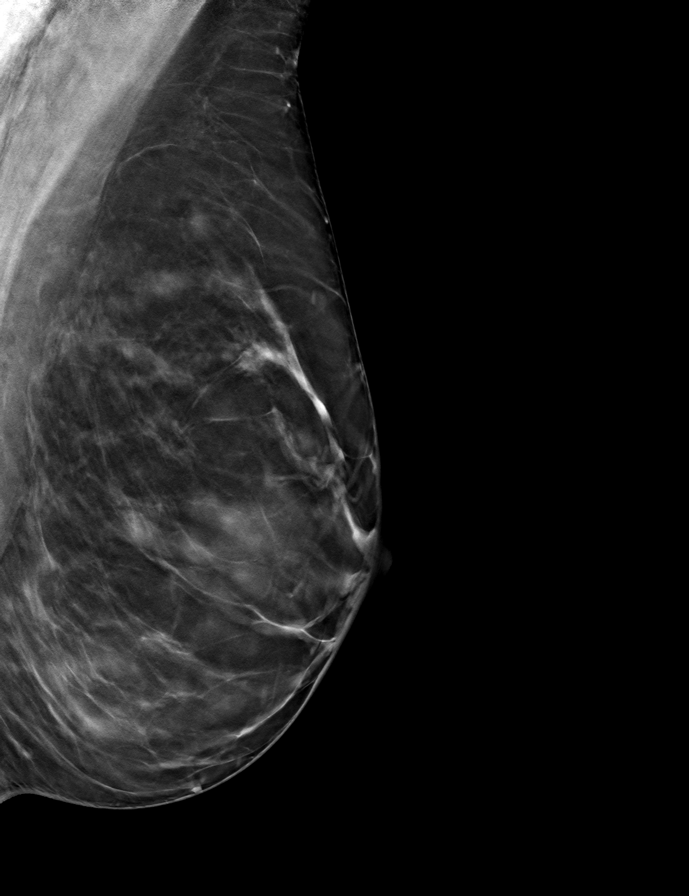

[R CC tomo · tomo slice 37/74.0]
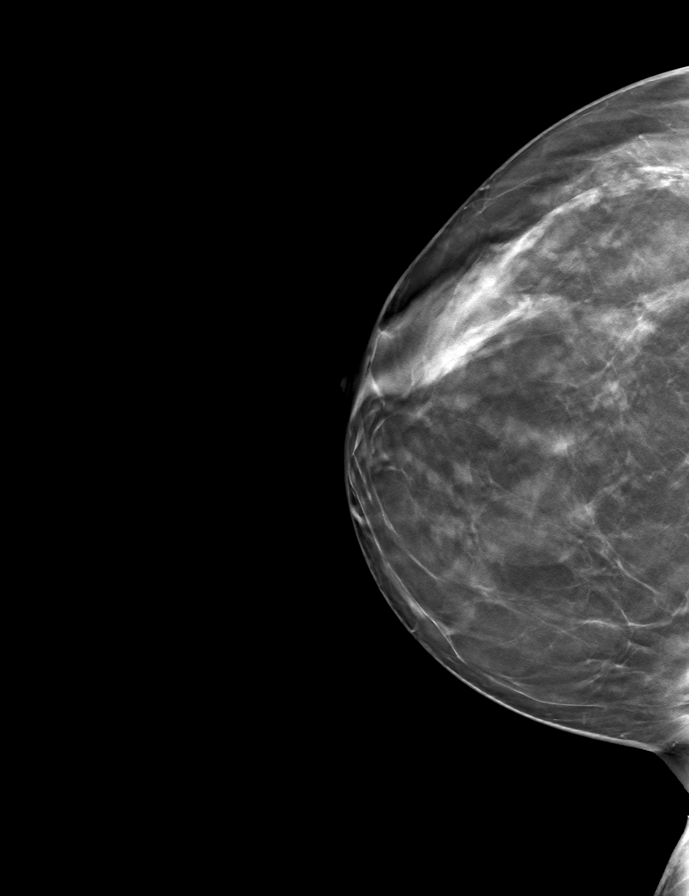

[L CC tomo · tomo slice 37/73.0]
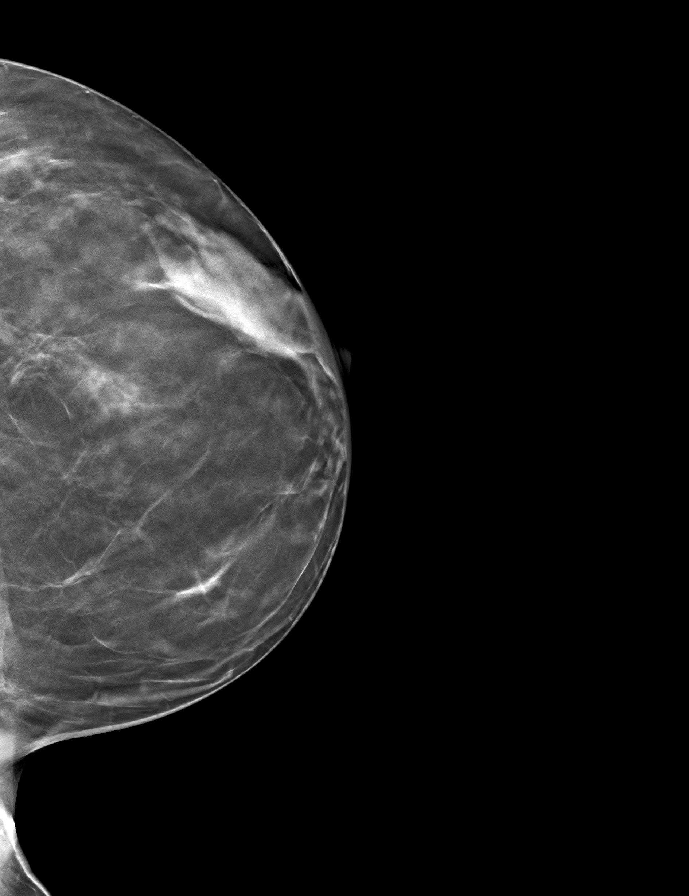

[9 of 24 positions shown; findings below may reference images not displayed]

ACR Breast Density Category b: There are scattered areas of
fibroglandular density.
FINDINGS: In the left breast, a possible asymmetries warrant further
evaluation. In the right breast, no findings suspicious for
malignancy. Images were processed with CAD.
IMPRESSION: Further evaluation is suggested for possible asymmetries in the left
breast.

RECOMMENDATION:
Diagnostic mammogram and possibly ultrasound of the left breast.
(Code:[id number])

The patient will be contacted regarding the findings, and additional
imaging will be scheduled.

BI-RADS CATEGORY  0: Incomplete. Need additional imaging evaluation
and/or prior mammograms for comparison.

## 2019-02-16 IMAGING — US US BREAST BX W LOC DEV 1ST LESION IMG BX SPEC US GUIDE*L*
1 series · 13 of 15 positions shown · non-contrast
Comparison: Previous exam(s).

ADDENDUM:
Pathology revealed GRADE I INVASIVE DUCTAL CARCINOMA, DUCTAL
CARCINOMA IN SITU of the Left breast, [DATE] 8 cmfn. This was found to
be concordant by Dr. Osmar Latimer .

Pathology results were discussed with the patient by telephone. The
patient reported doing well after the biopsy with tenderness at the
site. Post biopsy instructions and care were reviewed and questions
were answered. The patient was encouraged to call The [REDACTED]
referrals for treatment per patient request.
Pathology results reported by Chacha Aaron, RN on 04/16/2017.
CLINICAL DATA: 66-year-old patient presents for ultrasound-guided
biopsy of a left breast mass at 1 o'clock position 7 cm from the
nipple.
EXAM:
ULTRASOUND GUIDED LEFT BREAST CORE NEEDLE BIOPSY

[Series 1: us breast bx w loc dev 1st lesion img bx spec us g · 0.06mm/px · 13 of 15 slices shown]
[im 1/15]
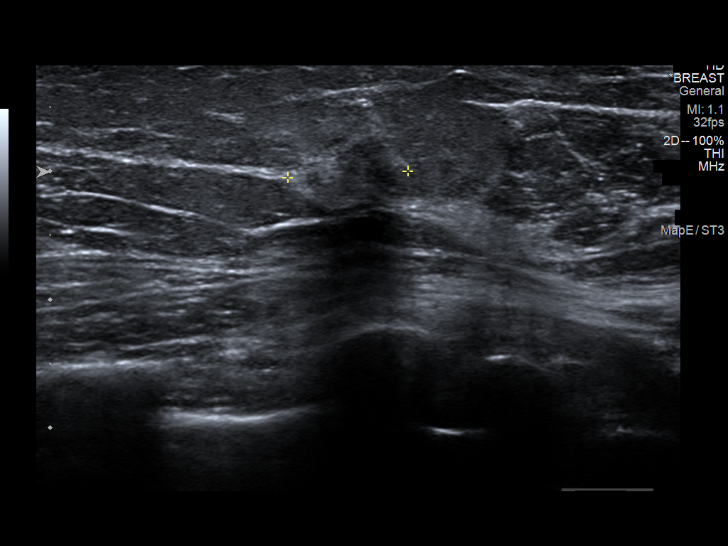
[im 2/15]
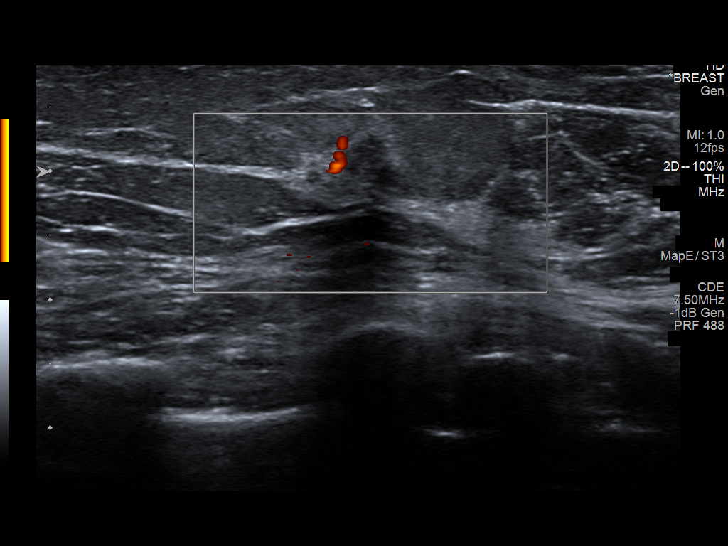
[im 3/15]
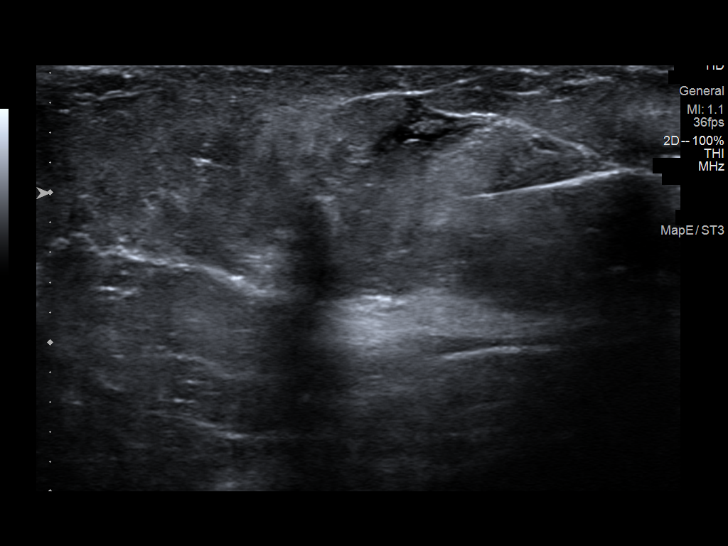
[im 5/15]
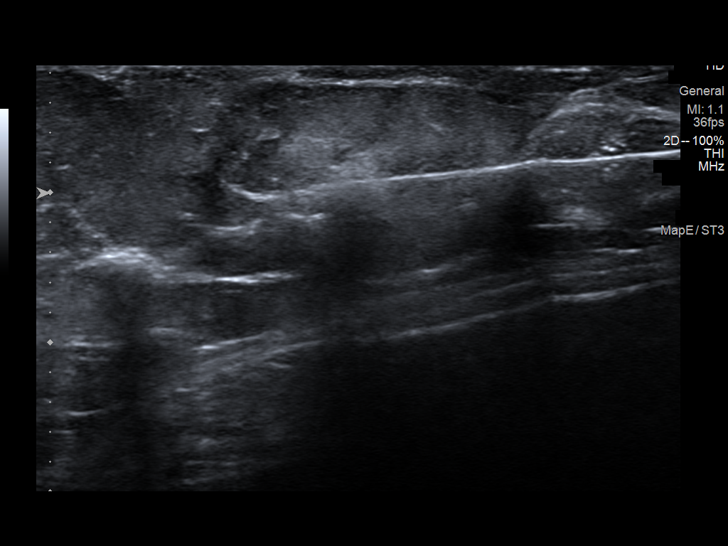
[im 6/15]
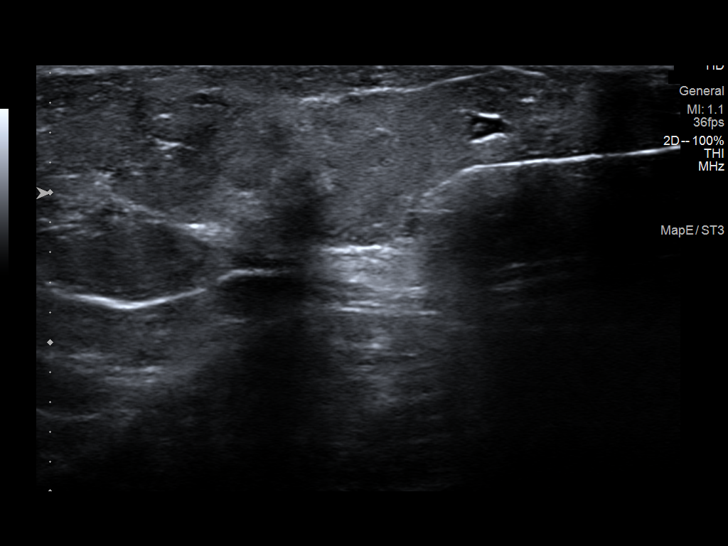
[im 7/15]
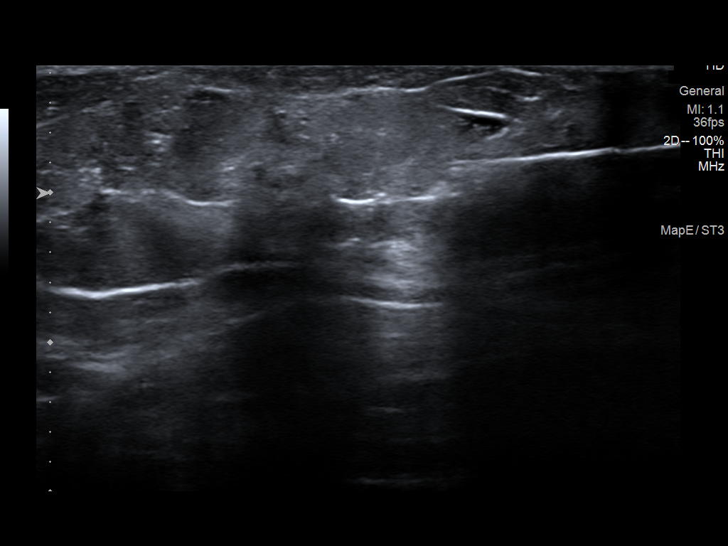
[im 8/15]
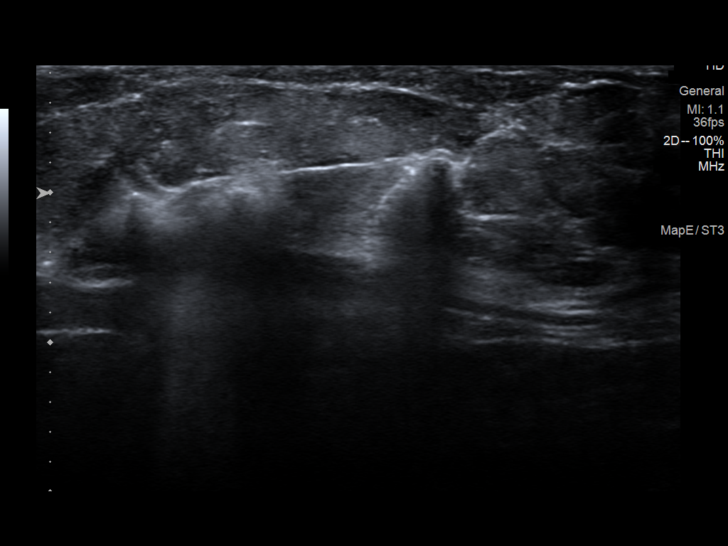
[im 9/15]
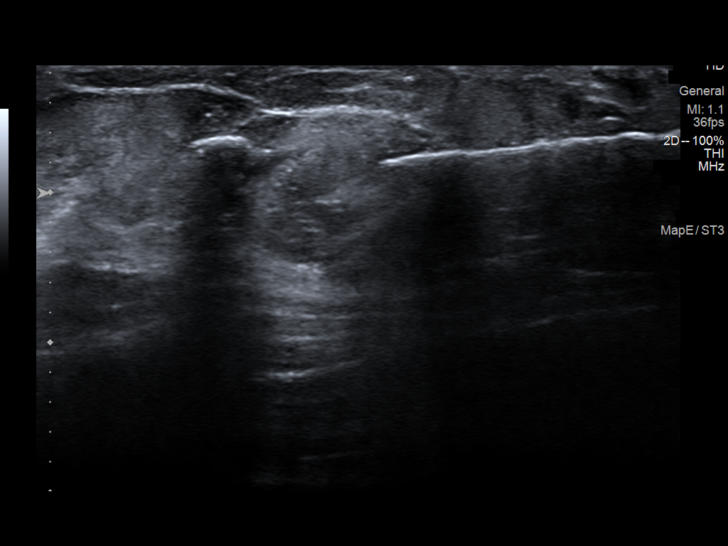
[im 10/15]
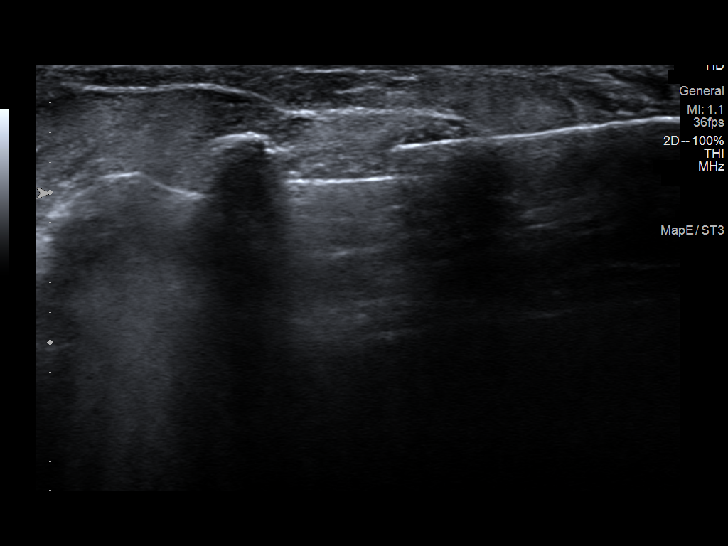
[im 11/15]
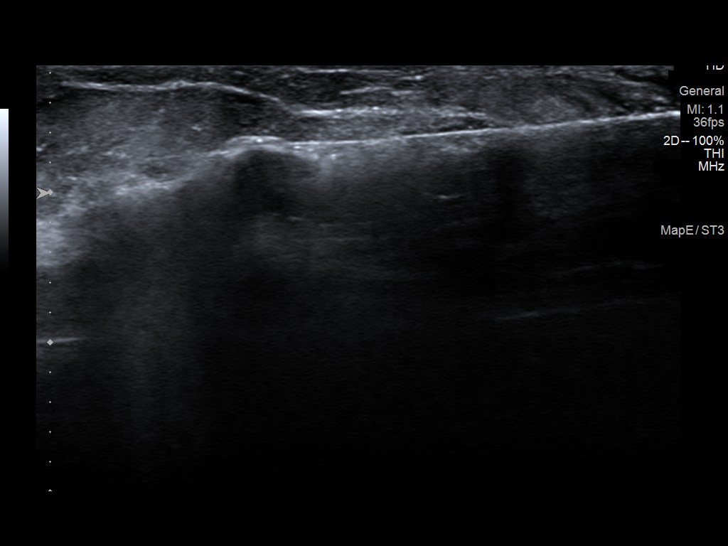
[im 13/15]
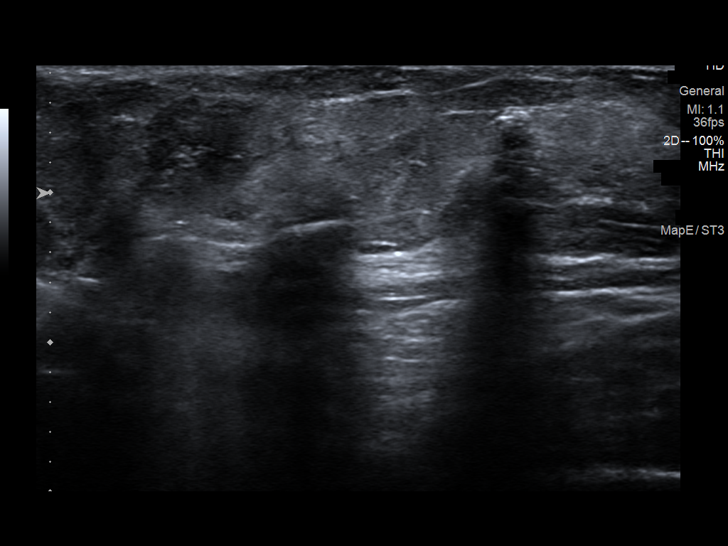
[im 14/15]
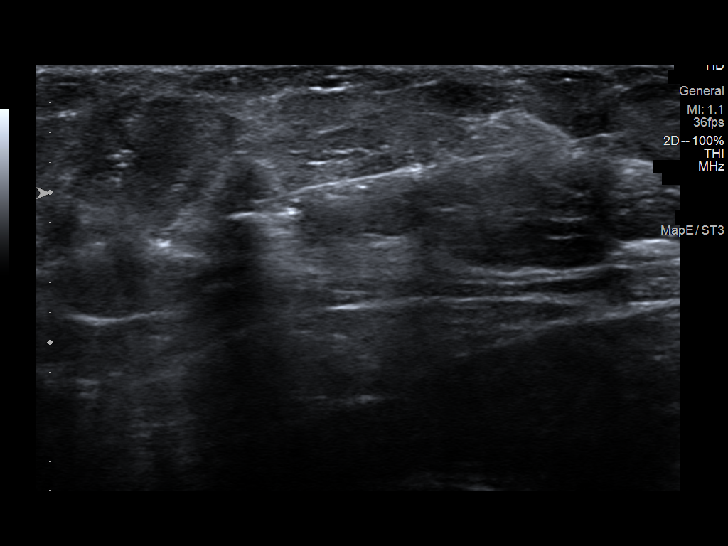
[im 15/15]
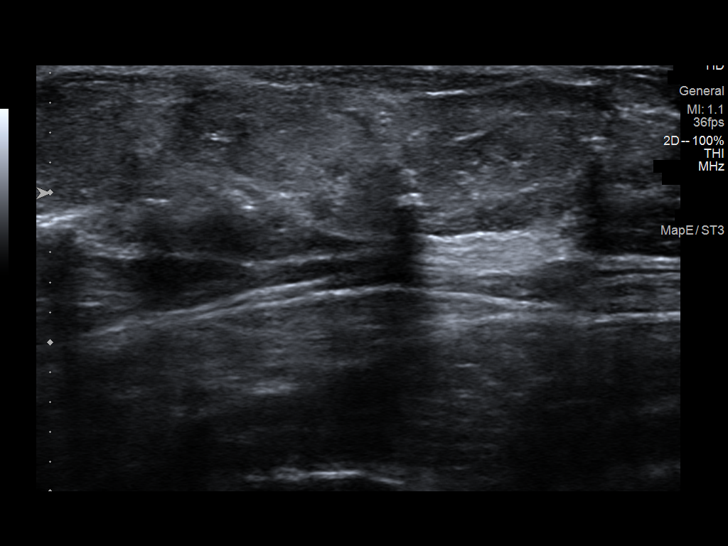

[13 of 15 positions shown; findings below may reference images not displayed]



Lesion quadrant: Upper outer quadrant

Using sterile technique and 1% Lidocaine as local anesthetic, under
direct ultrasound visualization, a 12 gauge Gmz device was
used to perform biopsy of a 1 cm mass in the 1 o'clock position left
breast using a lateral approach. At the conclusion of the procedure
a heart shaped tissue marker clip was deployed into the biopsy
cavity. Follow up 2 view mammogram was performed and dictated
separately.
IMPRESSION: Ultrasound guided biopsy of left breast.  No apparent complications.

## 2019-05-31 ENCOUNTER — Other Ambulatory Visit: Payer: Self-pay | Admitting: *Deleted

## 2019-05-31 DIAGNOSIS — M67911 Unspecified disorder of synovium and tendon, right shoulder: Secondary | ICD-10-CM

## 2019-05-31 MED ORDER — NITROGLYCERIN 0.2 MG/HR TD PT24: MEDICATED_PATCH | TRANSDERMAL | 0 refills | Status: DC

## 2019-06-03 ENCOUNTER — Ambulatory Visit: Payer: BLUE CROSS/BLUE SHIELD | Admitting: Sports Medicine

## 2019-06-15 ENCOUNTER — Encounter: Payer: Self-pay | Admitting: Sports Medicine

## 2019-06-15 ENCOUNTER — Other Ambulatory Visit: Payer: Self-pay

## 2019-06-15 ENCOUNTER — Ambulatory Visit (INDEPENDENT_AMBULATORY_CARE_PROVIDER_SITE_OTHER): Payer: 59 | Admitting: Sports Medicine

## 2019-06-15 ENCOUNTER — Ambulatory Visit: Payer: Self-pay

## 2019-06-15 VITALS — BP 112/72 | Ht 64.25 in | Wt 154.0 lb

## 2019-06-15 DIAGNOSIS — M25511 Pain in right shoulder: Secondary | ICD-10-CM

## 2019-06-15 NOTE — Patient Instructions (Signed)
You have what is referred to as an acute on chronic rotator cuff tear Be important for you to do your exercises as instructed today Do not use the 2 pound weight if you find it too painful We will rescan your shoulder in 6 weeks Nitroglycerin Protocol   Apply 1/4 nitroglycerin patch to affected area daily.  Change position of patch within the affected area every 24 hours.  You may experience a headache during the first 1-2 weeks of using the patch, these should subside.  If you experience headaches after beginning nitroglycerin patch treatment, you may take your preferred over the counter pain reliever.  Another side effect of the nitroglycerin patch is skin irritation or rash related to patch adhesive.  Please notify our office if you develop more severe headaches or rash, and stop the patch.  Tendon healing with nitroglycerin patch may require 12 to 24 weeks depending on the extent of injury.  Do not use if you have migraines or rosacea.

## 2019-06-15 NOTE — Progress Notes (Signed)
   [facility] [address] [place], [place] [address] Phone: [phone] Fax: [phone]   Patient Name: Crystal Robles Date of Birth: 03/17/1950 Medical Record Number: [id number] Gender: female Date of Encounter: 06/15/2019  SUBJECTIVE:      Chief Complaint:  Right shoulder pain   HPI:  Crystal Robles is a 69 year old RHD F presenting with 1 month of right shoulder pain.  Her and her husband recently moved into a new house, requiring her to lift and move many boxes.  She denies a pop.  She has been treated for chronic rotator cuff pathology for the last 7 years.  She denies any swelling or bleeding.  She notices weakness with overhead activity.  She denies numbness or tingling into her fingertips.     ROS:     See HPI.   PERTINENT  PMH / PSH / FH / SH:  Past Medical, Surgical, Social, and Family History Reviewed & Updated in the EMR. Pertinent findings include:  Rotator cuff tendinopathy, breast cancer survivor   OBJECTIVE:  BP 112/72   Ht 5' 4.25" (1.632 m)   Wt 154 lb (69.9 kg)   BMI 26.23 kg/m  Physical Exam:  Vital signs are reviewed.   GEN: Alert and oriented, NAD Pulm: Breathing unlabored PSY: normal mood, congruent affect  MSK: Right shoulder Well developed, well nourished, in no acute distress. No swelling, ecchymoses.  No gross deformity. No TTP. FROM. Strength 3+/5 with empty can Strength 4/5 with resisted external rotation Strength 5/5 in biceps testing Positive Neers. Negative Yergasons. Negative apprehension. NV intact distally.  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 Kathrynn Speed and Dr. Peterson Ao B.  Fields, MD   ASSESSMENT & PLAN:   1. Acute on chronic right rotator cuff tear  Given that we have been treating this for many years, I believe with the lifting of boxes she had an acute injury on an already torn tendon.  We will start conservatively with a home exercise program and nitroglycerin protocol.  She will follow-up with [place] in 6 weeks at which time we can ultrasound her shoulder again to look for signs of healing.   Lanier Clam, DO, [facility] Sports Medicine Fellow  I observed and examined the patient with Dr. Kathrynn Speed and agree with assessment and plan.  Note reviewed and modified by me. Ila Mcgill, MD

## 2019-06-29 ENCOUNTER — Other Ambulatory Visit: Payer: Self-pay | Admitting: Sports Medicine

## 2019-06-29 DIAGNOSIS — M67911 Unspecified disorder of synovium and tendon, right shoulder: Secondary | ICD-10-CM

## 2019-07-27 ENCOUNTER — Ambulatory Visit (INDEPENDENT_AMBULATORY_CARE_PROVIDER_SITE_OTHER): Payer: 59 | Admitting: Sports Medicine

## 2019-07-27 ENCOUNTER — Other Ambulatory Visit: Payer: Self-pay

## 2019-07-27 DIAGNOSIS — M67911 Unspecified disorder of synovium and tendon, right shoulder: Secondary | ICD-10-CM

## 2019-07-27 NOTE — Patient Instructions (Signed)
It was great to meet you today! Thank you for letting me participate in your care!  Today, we discussed your right shoulder pain due to a partial tear in your rotator cuff. I am glad it is improving with the exercises. Please continue the exercises with a 2lb weight and using the nitro patch. The ultrasound we did today also showed evidence that your biceps tendon tear is improving.  Please return to clinic in 6 weeks to ensure you are continuing to improve.  Be well, Harolyn Rutherford, DO PGY-3, [facility]

## 2019-07-27 NOTE — Progress Notes (Signed)
    SUBJECTIVE:   CHIEF COMPLAINT / HPI:   Right Shoulder Pain Follow Up Patient is a 69y/o female who presents for follow up today for right shoulder pain. She was seen last in our clinic about 6 weeks ago and diagnosed with a chronic biceps tendon partial tear and supraspinatus partial tear with right shoulder bursitis. She has been doing her home exercises with 2lb weight and she states her symptoms have improved remarkably. She can do the exercises without pain. She is no longer waking up at night in pain. She is still using the nitro patch. She recently went back to a work out class that she had to stop before due to pain and she was able to complete the workout and did not have pain after. Overall, improving.  PERTINENT  PMH / PSH: Hx of Rotator cuff tendinopathy  OBJECTIVE:   BP 112/68   Ht 5\' 4"  (1.626 m)   Wt 150 lb (68 kg)   BMI 25.75 kg/m   Gen: NAD Shoulder, Right: No evidence of bony deformity, asymmetry, or muscle atrophy; No tenderness over long head of biceps (bicipital groove). No TTP at [facility] joint. Full active and passive range of motion (180 flex Huel Cote /150Abd /90ER /70IR), Thumb to T12 without significant tenderness. Strength 4/5 throughout. No abnormal scapular function observed. Sensation intact. Peripheral pulses intact. Special Tests: - Jobe test: NEG   - Hawkins: NEG   - Neer test: NEG   - Gerber lift-off test: Positive   - Belly press test: NEG   - Drop arm test: NEG  Minimal pain with resisted impingement tests and good strength  Ultrasound Right Shoulder  On SAX hypoechoic change to the biceps tendon at proximal groove and at intersection with pectoralis major tendon Biceps LAX shows hypoechoic area about 2 cms in length along medial tendon consistent  with  chronic tear  Bursas swelling seen in previous US is improved with < 50% of hypoechoic chang noted before Partial thickness supraspinatus tear noted from last exam with no evidence of worsening  or retraction and mild improvement in hypoechoic swelling.  Arthritic changes seen at anterior humeral head with irregularity Infraspinatus and Teres minor are normal AC joint unremarkable  Impression; Chronic tear of biceps tendon stable; Supraspinatus moderately high grade tear is stable; Bursal swelling reduced  Ultrasound and interpretation by Dr. Garlan Fillers and Wolfgang Phoenix. 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 Improving.  - Cont home exercises with current 2lb weight and reintroduce activities as tolerated. Most likely will need to continue these as part of a daily regimen to prevent symptoms from returning. - cont with nitro patch for 6 more weeks - follow up in clinic in 6 weeks     Nuala Alpha, [place]  I observed and examined the patient with Dr. Garlan Fillers and agree with assessment and plan.  Note reviewed and modified by me. Ila Mcgill, MD

## 2019-07-27 NOTE — Assessment & Plan Note (Signed)
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 Improving.  - Cont home exercises with current 2lb weight and reintroduce activities as tolerated. Most likely will need to continue these as part of a daily regimen to prevent symptoms from returning. - cont with nitro patch for 6 more weeks - follow up in clinic in 6 weeks

## 2019-09-07 ENCOUNTER — Ambulatory Visit: Payer: 59 | Admitting: Sports Medicine

## 2019-09-14 ENCOUNTER — Ambulatory Visit (INDEPENDENT_AMBULATORY_CARE_PROVIDER_SITE_OTHER): Payer: 59 | Admitting: Sports Medicine

## 2019-09-14 ENCOUNTER — Other Ambulatory Visit: Payer: Self-pay

## 2019-09-14 VITALS — BP 122/78 | Ht 64.0 in | Wt 154.0 lb

## 2019-09-14 DIAGNOSIS — M67911 Unspecified disorder of synovium and tendon, right shoulder: Secondary | ICD-10-CM

## 2019-09-14 MED ORDER — NITROGLYCERIN 0.2 MG/HR TD PT24: MEDICATED_PATCH | TRANSDERMAL | 1 refills | Status: DC

## 2019-09-14 NOTE — Patient Instructions (Signed)
It was great to see you today! Thank you for letting me participate in your care!  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  It is fine to return to your normal activities and slowly increase both the frequency and duration of these activities as pain tolerates.  He can continue using the nitro patches for another 6 weeks however I think you will begin to get diminishing returns on these and after 6 weeks if you feel like you still need them you should probably give [place] a call.  Otherwise, I am glad you are doing much better and we will see you as needed.  Be well, Harolyn Rutherford, DO PGY-4, Sports Medicine Fellow [place]

## 2019-09-14 NOTE — Progress Notes (Signed)
    SUBJECTIVE:   CHIEF COMPLAINT / HPI:   Follow-up for right shoulder pain Crystal Robles is a very pleasant 69 year old female who presents today for continued follow-up for her right shoulder pain.  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  She is continue doing home exercises and says her symptoms have continued to improve slightly better than when we saw her about 6 weeks ago.  The day before yesterday she played pickle ball for approximately 20 minutes and said she had no pain and did not have to stop due to any pain.  She states she could  feel that the shoulder was possibly stiff and some "discomfort was there" but she had could have easily played more but wanted to ease back into things.  She continues to use the nitro patches and states she thinks they are still helping her.  PERTINENT  PMH / PSH: History of rotator cuff tendinopathy  OBJECTIVE:   BP 122/78   Ht 5\' 4"  (1.626 m)   Wt 154 lb (69.9 kg)   BMI 26.43 kg/m   Shoulder, Right: No evidence of bony deformity, asymmetry, or muscle atrophy; No tenderness over long head of biceps (bicipital groove). No TTP at [facility] joint. Somewhat limited activerange of motion in flex and external rotation but no pain. Strength 5/5 throughout. Sensation intact. Peripheral pulses intact.  Special Tests: - Jobe test: NEG   - Hawkins: NEG   - Neer test: NEG   - Belly press test: Positive  Limited U/S of Right Shoulder: Biceps tendon viewed in short and long axis with chronic hypoechoic changes at proximal groove and improving hypoechoic area along medial tendon with little to no surrounding hypoechoic fluid surrounding the tendon. Partial thickness supraspinatous tear still present however the area of hypoechoic change and fiber disruption is decreased from previous exam. Hypoechoic fluid above  the tendon indicating bursal swelling also decreased. 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  Ultrasound and interpretation by Dr. Garlan Fillers and Wolfgang Phoenix. Fields, MD      ASSESSMENT/PLAN:   Tendinopathy of rotator cuff Patient will continue home exercises for maintenance strength of the supraspinatus and biceps tendon as this continues to help and she has slightly improved from her previous visit. -Discussed refilling nitro patches and I think it is reasonable to refill for six more weeks one more time however I did let her know I think she would begin to get diminishing gains from using the nitro patches with his last refill and that if she feels like she needs to continue them after this refill she should probably call and discuss with [place] first. -Gust with patient I think she is free to begin return to full activities slowly and build up both the time and frequency of her activities that she is doing before e.g. pickle ball and let pain be the guide. -Follow-up as needed     Crystal Alpha, DO PGY-4, Sports Medicine Fellow [place]  I observed and examined the patient with theSM  resident and agree with assessment and plan.  Note reviewed and modified by me. Crystal Mcgill, MD

## 2019-09-14 NOTE — Assessment & Plan Note (Addendum)
Patient will continue home exercises for maintenance strength of the supraspinatus and biceps tendon as this continues to help and she has slightly improved from her previous visit. -Discussed refilling nitro patches and I think it is reasonable to refill for six more weeks and perhaps longer, however I did let her know I think she would begin to get diminishing gains from using the nitro patches with his last refill and that if she feels like she needs to continue them after this refill she should  call and discuss with [place] first. -Discussed with patient I think she is free to begin return to full activities slowly and build up both the time and frequency of her activities that she is doing before e.g. pickle ball and let pain be the guide. -Follow-up as in ~ 2 months to see if healing progresses Biceps essentially resolved Supraspinatus tear looks 50% smaller Note this is same shoulder hurt in 2016 and 2018  Note:  Patient is vaccine hesitant and I had a discussion to explain why I thought she and her family should take the vaccine.

## 2020-01-13 ENCOUNTER — Other Ambulatory Visit: Payer: Self-pay | Admitting: Obstetrics and Gynecology

## 2020-01-13 DIAGNOSIS — Z9889 Other specified postprocedural states: Secondary | ICD-10-CM

## 2020-04-06 ENCOUNTER — Other Ambulatory Visit: Payer: Self-pay

## 2020-04-06 ENCOUNTER — Ambulatory Visit
Admission: RE | Admit: 2020-04-06 | Discharge: 2020-04-06 | Disposition: A | Discharge status: Home / Self Care | Payer: 59 | Source: Ambulatory Visit | Attending: Obstetrics and Gynecology | Admitting: Obstetrics and Gynecology

## 2020-04-06 DIAGNOSIS — Z9889 Other specified postprocedural states: Secondary | ICD-10-CM

## 2020-05-23 ENCOUNTER — Ambulatory Visit (INDEPENDENT_AMBULATORY_CARE_PROVIDER_SITE_OTHER): Payer: 59 | Admitting: Sports Medicine

## 2020-05-23 ENCOUNTER — Encounter: Payer: Self-pay | Admitting: Sports Medicine

## 2020-05-23 ENCOUNTER — Other Ambulatory Visit: Payer: Self-pay

## 2020-05-23 DIAGNOSIS — G8929 Other chronic pain: Secondary | ICD-10-CM

## 2020-05-23 DIAGNOSIS — M79672 Pain in left foot: Secondary | ICD-10-CM

## 2020-05-23 NOTE — Progress Notes (Signed)
    SUBJECTIVE:   CHIEF COMPLAINT / HPI:   Patient presenting with left heel pain that has been present for 3 years.  She reports that it occurs around wintertime and usually last a couple months.  She reports that its been lasting longer each year.  Initially, lasted about 2 months before resolving on its own.  Now, has been present since January.  She had assumed that it was plantar fasciitis and treated with elevation and icing.  This year, she has continued elevation and icing without relief.  She denies any changes in activity in the seasons and no increased activity in the wintertime.  She walks in does weight classes and reports pain after a few miles.  She does report pain with rest as well.  Initially, insoles were helpful, now they have not made much improvement.  PERTINENT  PMH / PSH: No previous ankle or foot injuries or surgeries.  OBJECTIVE:   BP 112/80   Ht 5\' 4"  (1.626 m)   Wt 156 lb (70.8 kg)   BMI 26.78 kg/m   Left heel: Inspection: No obvious swelling or deformity.  Palpation: No tenderness to palpation of medial or lateral calcaneus.  No tenderness palpation of Achilles tendon or insertion site.  Heel ultrasound, limited  Bony spurring appreciated on plantar aspect of calcaneous with overlying hypoechoic area likely representing fluid collection.   ASSESSMENT/PLAN:   Heel pain  Likely due to spurring and bony changes of plantar calcaneus as appreciated on limited ultrasound today  - provided Tulli heel cups for continuous wear - follow up in 8 weeks    Wilber Oliphant, MD   I observed and examined the patient with the resident and agree with assessment and plan.  Note reviewed and modified by me.  We may need XR of this area if sxs do not improve with the current conservative care.  Os calcis is irregular with possible cystic change.  Ila Mcgill, MD

## 2020-05-24 DIAGNOSIS — G8929 Other chronic pain: Secondary | ICD-10-CM | POA: Insufficient documentation

## 2020-05-24 NOTE — Assessment & Plan Note (Signed)
Conservative care Icing Heel cups  Good shoe support  Reck 2 mos.

## 2020-06-08 ENCOUNTER — Other Ambulatory Visit: Payer: Self-pay

## 2020-06-08 DIAGNOSIS — M67911 Unspecified disorder of synovium and tendon, right shoulder: Secondary | ICD-10-CM

## 2020-06-08 MED ORDER — NITROGLYCERIN 0.2 MG/HR TD PT24
MEDICATED_PATCH | TRANSDERMAL | 1 refills | Status: DC
Start: 2020-06-08 — End: 2020-12-18

## 2020-06-22 ENCOUNTER — Ambulatory Visit: Payer: 59 | Admitting: Sports Medicine

## 2020-07-18 ENCOUNTER — Ambulatory Visit: Payer: 59 | Admitting: Sports Medicine

## 2020-12-15 ENCOUNTER — Other Ambulatory Visit: Payer: Self-pay | Admitting: Sports Medicine

## 2020-12-15 DIAGNOSIS — M67911 Unspecified disorder of synovium and tendon, right shoulder: Secondary | ICD-10-CM

## 2021-05-18 ENCOUNTER — Other Ambulatory Visit: Payer: Self-pay | Admitting: Obstetrics and Gynecology

## 2021-05-18 DIAGNOSIS — Z1231 Encounter for screening mammogram for malignant neoplasm of breast: Secondary | ICD-10-CM

## 2021-05-24 ENCOUNTER — Ambulatory Visit
Admission: RE | Admit: 2021-05-24 | Discharge: 2021-05-24 | Disposition: A | Discharge status: Home / Self Care | Payer: 59 | Source: Ambulatory Visit | Attending: Obstetrics and Gynecology | Admitting: Obstetrics and Gynecology

## 2021-05-24 DIAGNOSIS — Z1231 Encounter for screening mammogram for malignant neoplasm of breast: Secondary | ICD-10-CM

## 2022-08-05 ENCOUNTER — Other Ambulatory Visit: Payer: Self-pay | Admitting: Obstetrics and Gynecology

## 2022-08-05 DIAGNOSIS — Z0000 Encounter for general adult medical examination without abnormal findings: Secondary | ICD-10-CM

## 2022-10-15 ENCOUNTER — Ambulatory Visit: Payer: 59

## 2022-10-21 ENCOUNTER — Ambulatory Visit: Payer: 59

## 2022-10-24 ENCOUNTER — Ambulatory Visit
Admission: RE | Admit: 2022-10-24 | Discharge: 2022-10-24 | Disposition: A | Discharge status: Home / Self Care | Payer: BC Managed Care – PPO | Source: Ambulatory Visit | Attending: Obstetrics and Gynecology | Admitting: Obstetrics and Gynecology

## 2022-10-24 DIAGNOSIS — Z0000 Encounter for general adult medical examination without abnormal findings: Secondary | ICD-10-CM
